# Patient Record
Sex: Male | Born: 1980
Health system: Southern US, Community
[De-identification: ages and names within clinical notes are randomized; demographics above are authoritative.]

## PROBLEM LIST (undated history)

## (undated) ENCOUNTER — Emergency Department (HOSPITAL_BASED_OUTPATIENT_CLINIC_OR_DEPARTMENT_OTHER): Admission: EM

## (undated) DIAGNOSIS — J45909 Unspecified asthma, uncomplicated: Secondary | ICD-10-CM

## (undated) DIAGNOSIS — IMO0001 Reserved for inherently not codable concepts without codable children: Secondary | ICD-10-CM

## (undated) DIAGNOSIS — G43909 Migraine, unspecified, not intractable, without status migrainosus: Secondary | ICD-10-CM

## (undated) DIAGNOSIS — B019 Varicella without complication: Secondary | ICD-10-CM

## (undated) DIAGNOSIS — R030 Elevated blood-pressure reading, without diagnosis of hypertension: Secondary | ICD-10-CM

## (undated) HISTORY — DX: Migraine, unspecified, not intractable, without status migrainosus: G43.909

## (undated) HISTORY — DX: Unspecified asthma, uncomplicated: J45.909

## (undated) HISTORY — DX: Reserved for inherently not codable concepts without codable children: IMO0001

## (undated) HISTORY — DX: Elevated blood-pressure reading, without diagnosis of hypertension: R03.0

## (undated) HISTORY — DX: Varicella without complication: B01.9

## (undated) HISTORY — PX: CRANIOTOMY: SHX93

---

## 2003-03-11 HISTORY — PX: BRAIN SURGERY: SHX531

## 2009-03-05 ENCOUNTER — Emergency Department (HOSPITAL_COMMUNITY): Admission: EM | Admit: 2009-03-05 | Discharge: 2009-03-05 | Payer: Self-pay | Admitting: Emergency Medicine

## 2010-06-10 LAB — HERPES SIMPLEX VIRUS CULTURE: Culture: DETECTED

## 2010-06-10 LAB — GC/CHLAMYDIA PROBE AMP, GENITAL: Chlamydia, DNA Probe: NEGATIVE

## 2011-04-28 DIAGNOSIS — I499 Cardiac arrhythmia, unspecified: Secondary | ICD-10-CM | POA: Insufficient documentation

## 2011-04-29 DIAGNOSIS — R002 Palpitations: Secondary | ICD-10-CM | POA: Insufficient documentation

## 2011-04-29 DIAGNOSIS — I10 Essential (primary) hypertension: Secondary | ICD-10-CM | POA: Insufficient documentation

## 2011-04-29 DIAGNOSIS — R079 Chest pain, unspecified: Secondary | ICD-10-CM | POA: Insufficient documentation

## 2011-05-13 DIAGNOSIS — Z9889 Other specified postprocedural states: Secondary | ICD-10-CM | POA: Insufficient documentation

## 2011-05-13 DIAGNOSIS — IMO0002 Reserved for concepts with insufficient information to code with codable children: Secondary | ICD-10-CM | POA: Insufficient documentation

## 2011-05-13 DIAGNOSIS — Z8669 Personal history of other diseases of the nervous system and sense organs: Secondary | ICD-10-CM | POA: Insufficient documentation

## 2011-05-13 DIAGNOSIS — S069XAA Unspecified intracranial injury with loss of consciousness status unknown, initial encounter: Secondary | ICD-10-CM | POA: Insufficient documentation

## 2011-05-13 DIAGNOSIS — S069X9A Unspecified intracranial injury with loss of consciousness of unspecified duration, initial encounter: Secondary | ICD-10-CM | POA: Insufficient documentation

## 2011-06-19 ENCOUNTER — Emergency Department: Payer: Self-pay | Admitting: Emergency Medicine

## 2015-05-09 ENCOUNTER — Ambulatory Visit: Payer: Self-pay | Admitting: Primary Care

## 2015-05-14 ENCOUNTER — Ambulatory Visit (INDEPENDENT_AMBULATORY_CARE_PROVIDER_SITE_OTHER): Payer: BLUE CROSS/BLUE SHIELD | Admitting: Primary Care

## 2015-05-14 ENCOUNTER — Encounter: Payer: Self-pay | Admitting: Primary Care

## 2015-05-14 ENCOUNTER — Other Ambulatory Visit: Payer: Self-pay | Admitting: Primary Care

## 2015-05-14 VITALS — BP 144/86 | HR 73 | Temp 98.7°F | Ht 70.5 in | Wt 156.4 lb

## 2015-05-14 DIAGNOSIS — R229 Localized swelling, mass and lump, unspecified: Secondary | ICD-10-CM

## 2015-05-14 DIAGNOSIS — R5383 Other fatigue: Secondary | ICD-10-CM | POA: Diagnosis not present

## 2015-05-14 DIAGNOSIS — E0590 Thyrotoxicosis, unspecified without thyrotoxic crisis or storm: Secondary | ICD-10-CM | POA: Insufficient documentation

## 2015-05-14 DIAGNOSIS — R3912 Poor urinary stream: Secondary | ICD-10-CM

## 2015-05-14 DIAGNOSIS — IMO0001 Reserved for inherently not codable concepts without codable children: Secondary | ICD-10-CM | POA: Insufficient documentation

## 2015-05-14 DIAGNOSIS — R030 Elevated blood-pressure reading, without diagnosis of hypertension: Secondary | ICD-10-CM | POA: Diagnosis not present

## 2015-05-14 LAB — CBC
HEMATOCRIT: 46.8 % (ref 39.0–52.0)
HEMOGLOBIN: 15.9 g/dL (ref 13.0–17.0)
MCHC: 34.1 g/dL (ref 30.0–36.0)
MCV: 89 fl (ref 78.0–100.0)
Platelets: 209 10*3/uL (ref 150.0–400.0)
RBC: 5.25 Mil/uL (ref 4.22–5.81)
RDW: 13.7 % (ref 11.5–15.5)
WBC: 11.6 10*3/uL — ABNORMAL HIGH (ref 4.0–10.5)

## 2015-05-14 LAB — COMPREHENSIVE METABOLIC PANEL
ALT: 14 U/L (ref 0–53)
AST: 16 U/L (ref 0–37)
Albumin: 4.8 g/dL (ref 3.5–5.2)
Alkaline Phosphatase: 74 U/L (ref 39–117)
BUN: 15 mg/dL (ref 6–23)
CO2: 28 meq/L (ref 19–32)
Calcium: 9.6 mg/dL (ref 8.4–10.5)
Chloride: 103 mEq/L (ref 96–112)
Creatinine, Ser: 0.97 mg/dL (ref 0.40–1.50)
GFR: 114.58 mL/min (ref >60.00)
GLUCOSE: 106 mg/dL — AB (ref 70–99)
POTASSIUM: 4.5 meq/L (ref 3.5–5.1)
SODIUM: 138 meq/L (ref 135–145)
Total Bilirubin: 0.8 mg/dL (ref 0.2–1.2)
Total Protein: 7.3 g/dL (ref 6.0–8.3)

## 2015-05-14 LAB — TSH: TSH: 0.22 u[IU]/mL — ABNORMAL LOW (ref 0.35–4.50)

## 2015-05-14 LAB — PSA: PSA: 0.94 ng/mL (ref 0.10–4.00)

## 2015-05-14 LAB — T3, FREE: T3, Free: 3.4 pg/mL (ref 2.3–4.2)

## 2015-05-14 LAB — T4, FREE: FREE T4: 0.95 ng/dL (ref 0.60–1.60)

## 2015-05-14 NOTE — Assessment & Plan Note (Signed)
Ongoing for years with palpitations, difficulty gaining weight. FH of hyperthyroidism from mother. Will obtain TSH, Free t4 and t3. Also CBC, CMP. Exam unremarkable.

## 2015-05-14 NOTE — Assessment & Plan Note (Signed)
Above goal in clinic today, endorses elevated reading over 1 year ago. Could be due to underlying thyroid abnormality. Will rule out other causes, then will consider treating. He will monitor BP at home.

## 2015-05-14 NOTE — Assessment & Plan Note (Signed)
Cyst like masses to upper extremities and anterior chest wall. Suspect lipoma or cyst in nature. Appear being.

## 2015-05-14 NOTE — Assessment & Plan Note (Signed)
Present for numerous years. Difficulty initiating and also weak stream. Will obtain PSA today. No FH of prostate cancer per patient,

## 2015-05-14 NOTE — Progress Notes (Signed)
Pre visit review using our clinic review tool, if applicable. No additional management support is needed unless otherwise documented below in the visit note. 

## 2015-05-14 NOTE — Patient Instructions (Addendum)
Complete lab work prior to leaving today. I will notify you of your results once received.   Check your blood pressure daily, around the same time of day, for the next sevearl weeks.   Ensure that you have rested for 30 minutes prior to checking your blood pressure. Record your readings. I will be in touch soon regarding your BP readings.  It was a pleasure to meet you today! Please don't hesitate to call me with any questions. Welcome to [facility]!

## 2015-05-14 NOTE — Progress Notes (Signed)
Subjective:    Patient ID: Eric Shannon, male    DOB: 11/03/1980, 35 y.o.   MRN: [id number]  HPI  Eric Shannon is a 35 year old male who presents today to establish care and discuss the problems mentioned below. Will obtain old records. His last physical was years ago.   1) Asthma: Diagnosed with mild asthma in his 20's. Will experience occasional shortness of breath, denies wheezing. Does not have an albuterol inhaler as it caused him to cough.   2) Frequent Headaches/Migraines: History of in the past. Drastically reduced since crainotomy in 2003. Currently gets headaches once weekly which are tolerable. Denies recent migraines.    3) Elevated BP readings: Endorses history of elevated readings in the past, strong FH of HTN. BP above goal today. His last BP reading was over 1 year. Also experiencing other symptoms that may be thyroid related.  4) Thyroid Abnormality: Fatigue, shortness of breath, daily tachycardia/palpitations, dry skin, difficulty gaining weight. Was told about 1 year ago that he needed testing for possible thyroid abnormality. He did obtain testing about 1 year ago and believes his T3 and T4 were abnormal, but cannot remember for sure. He had work up for his palpitations in the past by wearing a Holter monitor. He doesn't believe the monitor showed any abnormality. His mother has what sounds to be hyperthyroidism.   5) Weak Urinary Stream: Present for numerous years. Difficulty initiating his stream and also has a weaker stream. He was once told in the past that his prostate was slightly swollen in the past. Denies FH of prostate cancer.  Review of Systems  Constitutional: Positive for fatigue.       Difficulty gaining weight  HENT: Positive for postnasal drip.   Respiratory: Positive for shortness of breath. Negative for cough and wheezing.   Cardiovascular: Positive for palpitations. Negative for chest pain.  Gastrointestinal: Negative for diarrhea and constipation.         Bowel movements once to twice daily  Endocrine: Positive for heat intolerance.  Genitourinary: Positive for difficulty urinating.       Weak urinary stream  Skin:       Small bumps to extremities to chest and arms.  Allergic/Immunologic: Negative for environmental allergies.  Neurological: Negative for weakness and headaches.       Past Medical History  Diagnosis Date  . Asthma   . Chickenpox   . Migraine   . Elevated blood pressure     Social History   Social History  . Marital Status: Single    Spouse Name: N/A  . Number of Children: N/A  . Years of Education: N/A   Occupational History  . Not on file.   Social History Main Topics  . Smoking status: Former Games developermoker  . Smokeless tobacco: Not on file  . Alcohol Use: 0.0 oz/week    0 Standard drinks or equivalent per week     Comment: social  . Drug Use: No  . Sexual Activity: Not on file   Other Topics Concern  . Not on file   Social History Narrative   Single.   1 daughter.   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   Enjoys spending time with his daughter.    No past surgical history on file.  Family History  Problem Relation Age of Onset  . Hypertension Father   . Hypertension Mother   . Hyperthyroidism Mother     No Known Allergies  No current outpatient  prescriptions on file prior to visit.   No current facility-administered medications on file prior to visit.    BP 144/86 mmHg  Pulse 73  Temp(Src) 98.7 F (37.1 C) (Oral)  Ht 5' 10.5" (1.791 m)  Wt 156 lb 6.4 oz (70.943 kg)  BMI 22.12 kg/m2  SpO2 99%    Objective:   Physical Exam  Constitutional: He is oriented to person, place, and time. He appears well-nourished.  Neck: Neck supple. No thyromegaly present.  Cardiovascular: Normal rate and regular rhythm.   Pulmonary/Chest: Effort normal and breath sounds normal. He has no wheezes. He has no rales.  Musculoskeletal: Normal range of motion.  Neurological: He is alert and  oriented to person, place, and time.  Skin: Skin is warm and dry.  Numerous, small, cyst-like masses to upper extremities and chest.  Psychiatric: He has a normal mood and affect.          Assessment & Plan:

## 2015-05-15 ENCOUNTER — Encounter: Payer: Self-pay | Admitting: *Deleted

## 2015-07-02 ENCOUNTER — Ambulatory Visit (INDEPENDENT_AMBULATORY_CARE_PROVIDER_SITE_OTHER): Payer: BLUE CROSS/BLUE SHIELD | Admitting: Primary Care

## 2015-07-02 ENCOUNTER — Ambulatory Visit (INDEPENDENT_AMBULATORY_CARE_PROVIDER_SITE_OTHER)
Admission: RE | Admit: 2015-07-02 | Discharge: 2015-07-02 | Disposition: A | Discharge status: Home / Self Care | Payer: BLUE CROSS/BLUE SHIELD | Source: Ambulatory Visit | Attending: Primary Care | Admitting: Primary Care

## 2015-07-02 ENCOUNTER — Encounter: Payer: Self-pay | Admitting: Primary Care

## 2015-07-02 VITALS — BP 140/86 | HR 88 | Temp 98.4°F | Ht 70.5 in | Wt 164.1 lb

## 2015-07-02 DIAGNOSIS — Z113 Encounter for screening for infections with a predominantly sexual mode of transmission: Secondary | ICD-10-CM

## 2015-07-02 DIAGNOSIS — R229 Localized swelling, mass and lump, unspecified: Secondary | ICD-10-CM

## 2015-07-02 DIAGNOSIS — M542 Cervicalgia: Secondary | ICD-10-CM

## 2015-07-02 DIAGNOSIS — M549 Dorsalgia, unspecified: Secondary | ICD-10-CM

## 2015-07-02 DIAGNOSIS — M778 Other enthesopathies, not elsewhere classified: Secondary | ICD-10-CM

## 2015-07-02 DIAGNOSIS — G8929 Other chronic pain: Secondary | ICD-10-CM

## 2015-07-02 DIAGNOSIS — M659 Synovitis and tenosynovitis, unspecified: Secondary | ICD-10-CM

## 2015-07-02 LAB — HEPATITIS C ANTIBODY: HCV AB: NEGATIVE

## 2015-07-02 LAB — HIV ANTIBODY (ROUTINE TESTING W REFLEX): HIV 1&2 Ab, 4th Generation: NONREACTIVE

## 2015-07-02 MED ORDER — NAPROXEN 500 MG PO TABS: 500.0000 mg | ORAL_TABLET | Freq: Two times a day (BID) | ORAL | Status: DC

## 2015-07-02 NOTE — Assessment & Plan Note (Signed)
Located to right posterior shoulder for several weeks. Appears to be benign cyst and is much alike the cyst to his left forearm. Overall decreasing in size. Will watch and wait for now. Patient is to notify me if no improvement or causes discomfort.

## 2015-07-02 NOTE — Assessment & Plan Note (Signed)
Chronic stiffness and pain with radiculopathy to left shoulder and upper extremity for years since craniotomy in 2003. Today with decreased ROM and discomfort with ROM exercises. Xray of c-spine pending for further evaluation.

## 2015-07-02 NOTE — Progress Notes (Signed)
Pre visit review using our clinic review tool, if applicable. No additional management support is needed unless otherwise documented below in the visit note. 

## 2015-07-02 NOTE — Patient Instructions (Signed)
Complete lab work prior to leaving today. I will notify you of your results once received. It takes several days for us to receive all of the results.   Complete xray(s) prior to leaving today. I will notify you of your results once received.  You may take Naproxen 500 mg tablets twice daily as needed for inflammation and pain to your elbow.   It was a pleasure to see you today!  Tendinitis Tendinitis is swelling and inflammation of the tendons. Tendons are band-like tissues that connect muscle to bone. Tendinitis commonly occurs in the:   Shoulders (rotator cuff).  Heels (Achilles tendon).  Elbows (triceps tendon). CAUSES Tendinitis is usually caused by overusing the tendon, muscles, and joints involved. When the tissue surrounding a tendon (synovium) becomes inflamed, it is called tenosynovitis. Tendinitis commonly develops in people whose jobs require repetitive motions. SYMPTOMS  Pain.  Tenderness.  Mild swelling. DIAGNOSIS Tendinitis is usually diagnosed by physical exam. Your health care provider may also order X-rays or other imaging tests. TREATMENT Your health care provider may recommend certain medicines or exercises for your treatment. HOME CARE INSTRUCTIONS   Use a sling or splint for as long as directed by your health care provider until the pain decreases.  Put ice on the injured area.  Put ice in a plastic bag.  Place a towel between your skin and the bag.  Leave the ice on for 15-20 minutes, 3-4 times a day, or as directed by your health care provider.  Avoid using the limb while the tendon is painful. Perform gentle range of motion exercises only as directed by your health care provider. Stop exercises if pain or discomfort increase, unless directed otherwise by your health care provider.  Only take over-the-counter or prescription medicines for pain, discomfort, or fever as directed by your health care provider. SEEK MEDICAL CARE IF:   Your pain and  swelling increase.  You develop new, unexplained symptoms, especially increased numbness in the hands. MAKE SURE YOU:   Understand these instructions.  Will watch your condition.  Will get help right away if you are not doing well or get worse.   This information is not intended to replace advice given to you by your health care provider. Make sure you discuss any questions you have with your health care provider.   Document Released: 02/22/2000 Document Revised: 03/17/2014 Document Reviewed: 05/13/2010 Elsevier Interactive Patient Education [employer]2016 Elsevier Inc.

## 2015-07-02 NOTE — Progress Notes (Signed)
Subjective:    Patient ID: Eric Shannon, male    DOB: May 18, 1980, 35 y.o.   MRN: [id number]  HPI  Eric Shannon is a 35 year old male who presents today with multiple complaints.  1) STD testing: He has been in a monogamous relationship for several months. He noticed a whitish, milky discharge for 1 day on Thursday last week. Denies any penile itching, swelling, discharge, testicular swelling. He's not been tested recently. His partner was tested recently with unremarkable results.   2) Skin Mass: Located to the posterior right shoulder that has been present for the past 2 weeks. He's noticed a reduction in size overall. He's had several cysts on his upper extremities in the past of the same resemblance. Denies pain.   3) Elbow pain: Located to the left elbow, especially to the medial and lateral sides. 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 His pain has been present for the past 1 week. He's taken tylenol and advil without improvement in pain. He's also been wearing a brace to his elbow daily for the past 1 week.    4) Neck Pain and Radiculopathy: History of craniometry in 2003, has occasional numbness and pain to left lateral neck and c-spine with radiation to left shoulder and left lower extremity. He denies recent injury or trauma. His symptoms of radiculopathy will occur sporadically and are no worse since his craniotomy.     Review of Systems  Genitourinary: Negative for dysuria, discharge, penile swelling and penile pain.  Musculoskeletal: Positive for arthralgias, neck pain and neck stiffness.  Skin:       Mass to right posterior shoulder.   Neurological: Positive for numbness.       Past Medical History  Diagnosis Date  . Asthma   . Chickenpox   . Migraine   . Elevated blood pressure      Social History   Social History  . Marital Status: Single    Spouse Name: N/A  . Number of Children: N/A  . Years  of Education: N/A   Occupational History  . Not on file.   Social History Main Topics  . Smoking status: Former Games developer  . Smokeless tobacco: Not on file  . Alcohol Use: 0.0 oz/week    0 Standard drinks or equivalent per week     Comment: social  . Drug Use: No  . Sexual Activity: Not on file   Other Topics Concern  . Not on file   Social History Narrative   Single.   1 daughter.   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   Enjoys spending time with his daughter.    No past surgical history on file.  Family History  Problem Relation Age of Onset  . Hypertension Father   . Hypertension Mother   . Hyperthyroidism Mother     No Known Allergies  No current outpatient prescriptions on file prior to visit.   No current facility-administered medications on file prior to visit.    BP 140/86 mmHg  Pulse 88  Temp(Src) 98.4 F (36.9 C) (Oral)  Ht 5' 10.5" (1.791 m)  Wt 164 lb 1.9 oz (74.444 kg)  BMI 23.21 kg/m2  SpO2 98%    Objective:   Physical Exam  Constitutional: He appears well-nourished.  Neck: No muscular tenderness present.  Stiffness with discomfort to flexion and lateral bending.  Cardiovascular: Normal rate and regular rhythm.   Pulmonary/Chest: Effort  normal and breath sounds normal.  Musculoskeletal:       Left elbow: He exhibits decreased range of motion. He exhibits no swelling. No tenderness found.  Skin: Skin is warm and dry.  1.5 cm circular skin mass to right posterior shoulder. Soft, immobile. Representative of benign cyst. Non tender.           Assessment & Plan:  Tendosynovitis:  Pain to medial and lateral sides of left elbow. Exam consistent with tendon irritation. No swelling. Mild decrease in ROM. Suspect caused by repetitive movements to extremities based off of line of work. Will have him start Naproxen BID PRN. Continue to wear brace at work. Ice PRN. Rest if possible. If no improvement, will consider physical therapy.   STD  testing:  Requesting testing for STD's as he noticed a milky white penile discharge x 1 day Thursday last week. No symptoms over the weekend. Labs pending.

## 2015-07-03 ENCOUNTER — Other Ambulatory Visit: Payer: Self-pay | Admitting: Primary Care

## 2015-07-03 DIAGNOSIS — A749 Chlamydial infection, unspecified: Secondary | ICD-10-CM

## 2015-07-03 LAB — HSV(HERPES SMPLX)ABS-I+II(IGG+IGM)-BLD
HERPES SIMPLEX VRS I-IGM AB (EIA): 1.45 {index} — AB
HSV 1 Glycoprotein G Ab, IgG: 24.2 Index — ABNORMAL HIGH (ref <0.90)
HSV 2 Glycoprotein G Ab, IgG: <0.9 Index (ref <0.90)

## 2015-07-03 LAB — GC/CHLAMYDIA PROBE AMP
CT PROBE, AMP APTIMA: DETECTED — AB
GC PROBE AMP APTIMA: NOT DETECTED

## 2015-07-03 LAB — RPR

## 2015-07-03 MED ORDER — AZITHROMYCIN 250 MG PO TABS: 1000.0000 mg | ORAL_TABLET | Freq: Once | ORAL | Status: DC

## 2015-07-04 ENCOUNTER — Other Ambulatory Visit: Payer: Self-pay | Admitting: Primary Care

## 2015-07-04 DIAGNOSIS — M542 Cervicalgia: Secondary | ICD-10-CM

## 2015-07-10 ENCOUNTER — Ambulatory Visit
Admission: RE | Admit: 2015-07-10 | Discharge: 2015-07-10 | Disposition: A | Discharge status: Home / Self Care | Payer: BLUE CROSS/BLUE SHIELD | Source: Ambulatory Visit | Attending: Primary Care | Admitting: Primary Care

## 2015-07-10 DIAGNOSIS — M50322 Other cervical disc degeneration at C5-C6 level: Secondary | ICD-10-CM | POA: Diagnosis not present

## 2015-07-10 DIAGNOSIS — M542 Cervicalgia: Secondary | ICD-10-CM

## 2015-07-10 MED ORDER — GADOBENATE DIMEGLUMINE 529 MG/ML IV SOLN
15.0000 mL | Freq: Once | INTRAVENOUS | Status: AC | PRN
  Administered 2015-07-10: 15 mL via INTRAVENOUS

## 2015-07-12 ENCOUNTER — Other Ambulatory Visit: Payer: Self-pay | Admitting: Primary Care

## 2015-07-12 DIAGNOSIS — M542 Cervicalgia: Secondary | ICD-10-CM

## 2015-07-23 DIAGNOSIS — M50122 Cervical disc disorder at C5-C6 level with radiculopathy: Secondary | ICD-10-CM | POA: Diagnosis not present

## 2015-07-23 DIAGNOSIS — Z79899 Other long term (current) drug therapy: Secondary | ICD-10-CM | POA: Diagnosis not present

## 2015-07-23 DIAGNOSIS — M549 Dorsalgia, unspecified: Secondary | ICD-10-CM | POA: Diagnosis not present

## 2015-07-23 DIAGNOSIS — S161XXA Strain of muscle, fascia and tendon at neck level, initial encounter: Secondary | ICD-10-CM | POA: Diagnosis not present

## 2015-07-24 DIAGNOSIS — E0590 Thyrotoxicosis, unspecified without thyrotoxic crisis or storm: Secondary | ICD-10-CM | POA: Diagnosis not present

## 2015-07-27 DIAGNOSIS — E0590 Thyrotoxicosis, unspecified without thyrotoxic crisis or storm: Secondary | ICD-10-CM | POA: Diagnosis not present

## 2015-07-27 DIAGNOSIS — L739 Follicular disorder, unspecified: Secondary | ICD-10-CM | POA: Diagnosis not present

## 2015-08-01 DIAGNOSIS — M50122 Cervical disc disorder at C5-C6 level with radiculopathy: Secondary | ICD-10-CM | POA: Diagnosis not present

## 2015-08-27 DIAGNOSIS — M50122 Cervical disc disorder at C5-C6 level with radiculopathy: Secondary | ICD-10-CM | POA: Diagnosis not present

## 2015-08-27 DIAGNOSIS — M5020 Other cervical disc displacement, unspecified cervical region: Secondary | ICD-10-CM | POA: Insufficient documentation

## 2015-09-25 DIAGNOSIS — M5020 Other cervical disc displacement, unspecified cervical region: Secondary | ICD-10-CM | POA: Diagnosis not present

## 2015-09-25 DIAGNOSIS — M50122 Cervical disc disorder at C5-C6 level with radiculopathy: Secondary | ICD-10-CM | POA: Diagnosis not present

## 2015-09-25 DIAGNOSIS — Z79899 Other long term (current) drug therapy: Secondary | ICD-10-CM | POA: Diagnosis not present

## 2015-09-25 DIAGNOSIS — Z9889 Other specified postprocedural states: Secondary | ICD-10-CM | POA: Diagnosis not present

## 2015-10-31 ENCOUNTER — Encounter: Payer: Self-pay | Admitting: Family Medicine

## 2015-10-31 ENCOUNTER — Encounter: Payer: Self-pay | Admitting: Primary Care

## 2015-10-31 ENCOUNTER — Ambulatory Visit (INDEPENDENT_AMBULATORY_CARE_PROVIDER_SITE_OTHER): Payer: BLUE CROSS/BLUE SHIELD | Admitting: Family Medicine

## 2015-10-31 VITALS — BP 102/80 | HR 76 | Wt 154.0 lb

## 2015-10-31 DIAGNOSIS — M5489 Other dorsalgia: Secondary | ICD-10-CM

## 2015-10-31 DIAGNOSIS — M542 Cervicalgia: Secondary | ICD-10-CM | POA: Diagnosis not present

## 2015-10-31 MED ORDER — GABAPENTIN 100 MG PO CAPS: ORAL_CAPSULE | ORAL | 3 refills | Status: DC

## 2015-10-31 MED ORDER — MELOXICAM 15 MG PO TABS
15.0000 mg | ORAL_TABLET | Freq: Every day | ORAL | 1 refills | Status: DC
Start: 2015-10-31 — End: 2017-10-13

## 2015-10-31 NOTE — Patient Instructions (Signed)
Continue exercises Try to walk daily   Gabapentin capsules or tablets What is this medicine? GABAPENTIN (GA ba pen tin) is used to control partial seizures in adults with epilepsy. It is also used to treat certain types of nerve pain. This medicine may be used for other purposes; ask your health care provider or pharmacist if you have questions. What should I tell my health care provider before I take this medicine? They need to know if you have any of these conditions: -kidney disease -suicidal thoughts, plans, or attempt; a previous suicide attempt by you or a family member -an unusual or allergic reaction to gabapentin, other medicines, foods, dyes, or preservatives -pregnant or trying to get pregnant -breast-feeding How should I use this medicine? Take this medicine by mouth with a glass of water. Follow the directions on the prescription label. You can take it with or without food. If it upsets your stomach, take it with food.Take your medicine at regular intervals. Do not take it more often than directed. Do not stop taking except on your doctor's advice. If you are directed to break the 600 or 800 mg tablets in half as part of your dose, the extra half tablet should be used for the next dose. If you have not used the extra half tablet within 28 days, it should be thrown away. A special MedGuide will be given to you by the pharmacist with each prescription and refill. Be sure to read this information carefully each time. Talk to your pediatrician regarding the use of this medicine in children. Special care may be needed. Overdosage: If you think you have taken too much of this medicine contact a poison control center or emergency room at once. NOTE: This medicine is only for you. Do not share this medicine with others. What if I miss a dose? If you miss a dose, take it as soon as you can. If it is almost time for your next dose, take only that dose. Do not take double or extra  doses. What may interact with this medicine? Do not take this medicine with any of the following medications: -other gabapentin products This medicine may also interact with the following medications: -alcohol -antacids -antihistamines for allergy, cough and cold -certain medicines for anxiety or sleep -certain medicines for depression or psychotic disturbances -homatropine; hydrocodone -naproxen -narcotic medicines (opiates) for pain -phenothiazines like chlorpromazine, mesoridazine, prochlorperazine, thioridazine This list may not describe all possible interactions. Give your health care provider a list of all the medicines, herbs, non-prescription drugs, or dietary supplements you use. Also tell them if you smoke, drink alcohol, or use illegal drugs. Some items may interact with your medicine. What should I watch for while using this medicine? Visit your doctor or health care professional for regular checks on your progress. You may want to keep a record at home of how you feel your condition is responding to treatment. You may want to share this information with your doctor or health care professional at each visit. You should contact your doctor or health care professional if your seizures get worse or if you have any new types of seizures. Do not stop taking this medicine or any of your seizure medicines unless instructed by your doctor or health care professional. Stopping your medicine suddenly can increase your seizures or their severity. Wear a medical identification bracelet or chain if you are taking this medicine for seizures, and carry a card that lists all your medications. You may get drowsy, dizzy,  or have blurred vision. Do not drive, use machinery, or do anything that needs mental alertness until you know how this medicine affects you. To reduce dizzy or fainting spells, do not sit or stand up quickly, especially if you are an older patient. Alcohol can increase drowsiness and  dizziness. Avoid alcoholic drinks. Your mouth may get dry. Chewing sugarless gum or sucking hard candy, and drinking plenty of water will help. The use of this medicine may increase the chance of suicidal thoughts or actions. Pay special attention to how you are responding while on this medicine. Any worsening of mood, or thoughts of suicide or dying should be reported to your health care professional right away. Women who become pregnant while using this medicine may enroll in the [place] Antiepileptic Drug Pregnancy Registry by calling [phone]. This registry collects information about the safety of antiepileptic drug use during pregnancy. What side effects may I notice from receiving this medicine? Side effects that you should report to your doctor or health care professional as soon as possible: -allergic reactions like skin rash, itching or hives, swelling of the face, lips, or tongue -worsening of mood, thoughts or actions of suicide or dying Side effects that usually do not require medical attention (report to your doctor or health care professional if they continue or are bothersome): -constipation -difficulty walking or controlling muscle movements -dizziness -nausea -slurred speech -tiredness -tremors -weight gain This list may not describe all possible side effects. Call your doctor for medical advice about side effects. You may report side effects to FDA at 1-800-FDA-1088. Where should I keep my medicine? Keep out of reach of children. This medicine may cause accidental overdose and death if it taken by other adults, children, or pets. Mix any unused medicine with a substance like cat litter or coffee grounds. Then throw the medicine away in a sealed container like a sealed bag or a coffee can with a lid. Do not use the medicine after the expiration date. Store at room temperature between 15 and 30 degrees C (59 and 86 degrees F). NOTE: This sheet is a summary. It may  not cover all possible information. If you have questions about this medicine, talk to your doctor, pharmacist, or health care provider.    2016, Elsevier/Gold Standard. (2013-04-22 15:26:50)

## 2015-10-31 NOTE — Progress Notes (Signed)
Subjective:    Patient ID: Eric Shannon, male    DOB: 01/27/1981, 35 y.o.   MRN: [id number]  HPI This is a 35 yo male who presents today with back and neck pain for several years. He has been seeing a specialist in [place]. He has a bulging disk at C5-6. He has had some trigger point injections without relief. He missed his last appointment due to going to the wrong building and does not have another appointment until October 2. He has had worsening pain over the last several days. Has tried naprosyn, muscle relaxers, gabapentin in the past without much relief. Pain 5-6/10. He has had chronic left arm numbness after craniotomy. Pain now radiating to right side front and back. Nothing ever helps. 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 It hurts him to move in and out of vehicles. He has had PT in the past and "they gave me little exercises." He does not currently do any stretching/exercises.   When asked what he would like to accomplish from his visit today he stated that he just wants to feel better.   Currently taking naprosyn 500 mg po twice a day.   Has two year old daughter.   Past Medical History:  Diagnosis Date  . Asthma   . Chickenpox   . Elevated blood pressure   . Migraine    No past surgical history on file. Family History  Problem Relation Age of Onset  . Hypertension Father   . Hypertension Mother   . Hyperthyroidism Mother    Social History  Substance Use Topics  . Smoking status: Former Games developermoker  . Smokeless tobacco: Not on file  . Alcohol use 0.0 oz/week     Comment: social      Review of Systems Per HPI    Objective:   Physical Exam  Constitutional: He is oriented to person, place, and time. He appears well-developed and well-nourished. No distress.  HENT:  Right Ear: External ear normal.  Left Ear: External ear normal.  Nose: Nose normal.  Mouth/Throat: Oropharynx is clear and moist.  Eyes: Conjunctivae and EOM are normal.  Pupils are equal, round, and reactive to light. Right eye exhibits no discharge. Left eye exhibits no discharge.  Neck: Normal range of motion. Neck supple. Muscular tenderness present. No spinous process tenderness present. No neck rigidity. No edema, no erythema and normal range of motion present.  Cardiovascular: Normal rate.   Pulmonary/Chest: Effort normal.  Neurological: He is alert and oriented to person, place, and time. He has normal strength and normal reflexes. No cranial nerve deficit or sensory deficit.  Skin: Skin is warm and dry. He is not diaphoretic.  Psychiatric: He has a normal mood and affect. His behavior is normal. Judgment and thought content normal.  Vitals reviewed.     BP 102/80   Pulse 76   Wt 154 lb (69.9 kg)   SpO2 98%   BMI 21.78 kg/m  Wt Readings from Last 3 Encounters:  10/31/15 154 lb (69.9 kg)  07/02/15 164 lb 1.9 oz (74.4 kg)  05/14/15 156 lb 6.4 oz (70.9 kg)       Assessment & Plan:  1. Neck pain - discussed chronic nature of his neck/back pain and reviewed previous interventions and what we can try going forward.  - encouraged him to continue planned follow up with specialist - discussed importance of continuing exercises, regular walking, core exercise strengthening.  - meloxicam (MOBIC)  15 MG tablet; Take 1 tablet (15 mg total) by mouth daily.  Dispense: 30 tablet; Refill: 1 - gabapentin (NEURONTIN) 100 MG capsule; Take one capsule at bedtime for a week then increase to one capsule twice a day  Dispense: 60 capsule; Refill: 3. Discussed ability to upward titrate   2. Midline back pain, unspecified location - meloxicam (MOBIC) 15 MG tablet; Take 1 tablet (15 mg total) by mouth daily.  Dispense: 30 tablet; Refill: 1 - gabapentin (NEURONTIN) 100 MG capsule; Take one capsule at bedtime for a week then increase to one capsule twice a day  Dispense: 60 capsule; Refill: 3  - he needs to come in for blood work for work related paperwork. Can follow  up back/neck pain at that time.   Olean Reeeborah Gessner, FNP-BC  [facility], [facility]  11/01/2015 10:55 AM

## 2015-12-07 ENCOUNTER — Ambulatory Visit (INDEPENDENT_AMBULATORY_CARE_PROVIDER_SITE_OTHER): Payer: BLUE CROSS/BLUE SHIELD | Admitting: Primary Care

## 2015-12-07 ENCOUNTER — Encounter: Payer: BLUE CROSS/BLUE SHIELD | Admitting: Primary Care

## 2015-12-07 ENCOUNTER — Encounter: Payer: Self-pay | Admitting: Primary Care

## 2015-12-07 VITALS — BP 126/80 | HR 80 | Temp 98.0°F | Ht 71.0 in | Wt 159.0 lb

## 2015-12-07 DIAGNOSIS — Z0000 Encounter for general adult medical examination without abnormal findings: Secondary | ICD-10-CM | POA: Diagnosis not present

## 2015-12-07 DIAGNOSIS — M50122 Cervical disc disorder at C5-C6 level with radiculopathy: Secondary | ICD-10-CM

## 2015-12-07 DIAGNOSIS — I499 Cardiac arrhythmia, unspecified: Secondary | ICD-10-CM | POA: Diagnosis not present

## 2015-12-07 DIAGNOSIS — Z23 Encounter for immunization: Secondary | ICD-10-CM | POA: Diagnosis not present

## 2015-12-07 DIAGNOSIS — IMO0001 Reserved for inherently not codable concepts without codable children: Secondary | ICD-10-CM

## 2015-12-07 DIAGNOSIS — Z0001 Encounter for general adult medical examination with abnormal findings: Secondary | ICD-10-CM | POA: Insufficient documentation

## 2015-12-07 DIAGNOSIS — E0590 Thyrotoxicosis, unspecified without thyrotoxic crisis or storm: Secondary | ICD-10-CM

## 2015-12-07 DIAGNOSIS — R030 Elevated blood-pressure reading, without diagnosis of hypertension: Secondary | ICD-10-CM

## 2015-12-07 LAB — LIPID PANEL
CHOL/HDL RATIO: 2
Cholesterol: 143 mg/dL (ref 0–200)
HDL: 58.4 mg/dL (ref >39.00)
LDL Cholesterol: 71 mg/dL (ref 0–99)
NONHDL: 84.36
TRIGLYCERIDES: 65 mg/dL (ref 0.0–149.0)
VLDL: 13 mg/dL (ref 0.0–40.0)

## 2015-12-07 LAB — COMPREHENSIVE METABOLIC PANEL
ALBUMIN: 4.4 g/dL (ref 3.5–5.2)
ALK PHOS: 69 U/L (ref 39–117)
ALT: 14 U/L (ref 0–53)
AST: 16 U/L (ref 0–37)
BILIRUBIN TOTAL: 0.7 mg/dL (ref 0.2–1.2)
BUN: 13 mg/dL (ref 6–23)
CALCIUM: 9.1 mg/dL (ref 8.4–10.5)
CHLORIDE: 103 meq/L (ref 96–112)
CO2: 32 mEq/L (ref 19–32)
CREATININE: 1.03 mg/dL (ref 0.40–1.50)
GFR: 106.55 mL/min (ref >60.00)
Glucose, Bld: 90 mg/dL (ref 70–99)
Potassium: 4.1 mEq/L (ref 3.5–5.1)
SODIUM: 138 meq/L (ref 135–145)
TOTAL PROTEIN: 7.4 g/dL (ref 6.0–8.3)

## 2015-12-07 NOTE — Assessment & Plan Note (Signed)
Rate and rhythm regular today. 

## 2015-12-07 NOTE — Progress Notes (Signed)
Subjective:    Patient ID: Eric Shannon, male    DOB: 02/19/1981, 35 y.o.   MRN: [id number]  HPI  Eric Shannon is a 35 year old male who presents today for complete physical.  Immunizations: -Tetanus: Unsure, believes it's been at or over 10 years.  -Influenza: Declines   Diet: Endorses a fair diet. Breakfast: Fast food Lunch: Restaurants, fast food, left overs Dinner: Chicken, shrimp, fish, vegetables, potatoes, mac and cheese Snacks: Little Debbie cakes, peanut butter crackers, oatmeal Desserts: Daily  Beverages: Water, little lemonade, juice, occasional soda  Exercise: He does not routinely exercise. Eye exam: Completed years ago. No acute change in vision.  Dental exam: Has not completed recently.    Review of Systems  Constitutional: Negative for unexpected weight change.  HENT: Negative for rhinorrhea.   Respiratory: Negative for cough and shortness of breath.   Cardiovascular: Negative for chest pain.  Gastrointestinal: Negative for constipation and diarrhea.  Genitourinary: Negative for difficulty urinating.  Musculoskeletal: Positive for neck pain. Negative for myalgias.  Skin: Negative for rash.  Allergic/Immunologic: Positive for environmental allergies.  Neurological: Negative for dizziness, numbness and headaches.  Psychiatric/Behavioral:       Denies concerns for anxiety or depression       Past Medical History:  Diagnosis Date  . Asthma   . Chickenpox   . Elevated blood pressure   . Migraine      Social History   Social History  . Marital status: Single    Spouse name: N/A  . Number of children: N/A  . Years of education: N/A   Occupational History  . Not on file.   Social History Main Topics  . Smoking status: Former Games developermoker  . Smokeless tobacco: Not on file  . Alcohol use 0.0 oz/week     Comment: social  . Drug use: No  . Sexual activity: Not on file   Other Topics Concern  . Not on file   Social History Narrative   Single.     1 daughter.   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   Enjoys spending time with his daughter.    No past surgical history on file.  Family History  Problem Relation Age of Onset  . Hypertension Father   . Hypertension Mother   . Hyperthyroidism Mother     No Known Allergies  Current Outpatient Prescriptions on File Prior to Visit  Medication Sig Dispense Refill  . gabapentin (NEURONTIN) 100 MG capsule Take one capsule at bedtime for a week then increase to one capsule twice a day (Patient not taking: Reported on 12/07/2015) 60 capsule 3  . meloxicam (MOBIC) 15 MG tablet Take 1 tablet (15 mg total) by mouth daily. (Patient not taking: Reported on 12/07/2015) 30 tablet 1   No current facility-administered medications on file prior to visit.     BP 126/80   Pulse 80   Temp 98 F (36.7 C) (Oral)   Ht 5\' 11"  (1.803 m)   Wt 159 lb (72.1 kg)   SpO2 98%   BMI 22.18 kg/m    Objective:   Physical Exam  Constitutional: He is oriented to person, place, and time. He appears well-nourished.  HENT:  Right Ear: Tympanic membrane and ear canal normal.  Left Ear: Tympanic membrane and ear canal normal.  Nose: Nose normal. Right sinus exhibits no maxillary sinus tenderness and no frontal sinus tenderness. Left sinus exhibits no maxillary sinus tenderness and no frontal sinus tenderness.  Mouth/Throat: Oropharynx is clear and moist.  Eyes: Conjunctivae and EOM are normal. Pupils are equal, round, and reactive to light.  Neck: Neck supple. Carotid bruit is not present. No thyromegaly present.  Cardiovascular: Normal rate, regular rhythm and normal heart sounds.   Pulmonary/Chest: Effort normal and breath sounds normal. He has no wheezes. He has no rales.  Abdominal: Soft. Bowel sounds are normal. There is no tenderness.  Musculoskeletal: Normal range of motion.  Neurological: He is alert and oriented to person, place, and time. He has normal reflexes. No cranial nerve deficit.  Skin:  Skin is warm and dry.  Psychiatric: He has a normal mood and affect.          Assessment & Plan:

## 2015-12-07 NOTE — Assessment & Plan Note (Signed)
Stable on today's visit, continue to monitor.

## 2015-12-07 NOTE — Patient Instructions (Signed)
You were provided with a tetanus vaccination which will cover you for 10 years.  Complete lab work prior to leaving today. I will notify you of your results once received.   It's importance to improve your diet by reducing consumption of fast food, fried food, processed snack foods, sugary drinks. Increase consumption of fresh vegetables and fruits, whole grains, water.  Ensure you are drinking 64 ounces of water daily.  Start exercising. You should be getting 150 minutes of moderate intensity exercise weekly.  I recommend an eye evaluation every 1-2 years, and dental evaluation annually.  Follow up in 1 year for repeat physical or sooner If needed.  It was a pleasure to see you today!

## 2015-12-07 NOTE — Assessment & Plan Note (Signed)
Working with specialist for further evaluation.

## 2015-12-07 NOTE — Assessment & Plan Note (Signed)
Tdap due, provided today, declines influenza. Fair diet, discussed to increased vegetables, fruit, whole grains, water. Discussed regular exercise. Exam today unremarkable. Labs pending. Follow up in 1 year for repeat physical.

## 2015-12-07 NOTE — Addendum Note (Signed)
Addended by: Tawnya CrookSAMBATH, Idrissa Beville on: 12/07/2015 03:08 PM   Modules accepted: Orders

## 2015-12-07 NOTE — Progress Notes (Signed)
Pre visit review using our clinic review tool, if applicable. No additional management support is needed unless otherwise documented below in the visit note. 

## 2015-12-07 NOTE — Assessment & Plan Note (Signed)
Following with endocrinology for hyperthyroidism.

## 2015-12-10 ENCOUNTER — Encounter: Payer: Self-pay | Admitting: *Deleted

## 2015-12-10 DIAGNOSIS — S161XXA Strain of muscle, fascia and tendon at neck level, initial encounter: Secondary | ICD-10-CM | POA: Diagnosis not present

## 2015-12-10 DIAGNOSIS — M50122 Cervical disc disorder at C5-C6 level with radiculopathy: Secondary | ICD-10-CM | POA: Diagnosis not present

## 2015-12-21 ENCOUNTER — Telehealth: Payer: Self-pay

## 2015-12-21 NOTE — Telephone Encounter (Signed)
Eric Shannon with [facility] left v/m that pt tested positive STD and [facility] has 7 days to submit treatment and as of today is over 30 days.Please advise. Fax # [phone].

## 2015-12-26 NOTE — Telephone Encounter (Signed)
Faxed a copy of the communicable disease report as requested.   Noted that on 07/03/2015, copy of the form [facility]

## 2016-02-29 ENCOUNTER — Ambulatory Visit (INDEPENDENT_AMBULATORY_CARE_PROVIDER_SITE_OTHER): Payer: BLUE CROSS/BLUE SHIELD | Admitting: Primary Care

## 2016-02-29 ENCOUNTER — Encounter (INDEPENDENT_AMBULATORY_CARE_PROVIDER_SITE_OTHER): Payer: Self-pay

## 2016-02-29 ENCOUNTER — Encounter: Payer: Self-pay | Admitting: Primary Care

## 2016-02-29 VITALS — BP 122/78 | HR 78 | Temp 98.6°F | Ht 70.5 in | Wt 160.8 lb

## 2016-02-29 DIAGNOSIS — R369 Urethral discharge, unspecified: Secondary | ICD-10-CM

## 2016-02-29 LAB — POC URINALSYSI DIPSTICK (AUTOMATED)
Bilirubin, UA: NEGATIVE
Blood, UA: NEGATIVE
Glucose, UA: NEGATIVE
KETONES UA: NEGATIVE
LEUKOCYTES UA: NEGATIVE
Nitrite, UA: NEGATIVE
PROTEIN UA: NEGATIVE
Spec Grav, UA: 1.02
UROBILINOGEN UA: NEGATIVE
pH, UA: 7

## 2016-02-29 MED ORDER — AZITHROMYCIN 250 MG PO TABS: ORAL_TABLET | ORAL | 0 refills | Status: DC

## 2016-02-29 MED ORDER — CEFTRIAXONE SODIUM 250 MG IJ SOLR
250.0000 mg | Freq: Once | INTRAMUSCULAR | Status: AC
  Administered 2016-02-29: 250 mg via INTRAMUSCULAR

## 2016-02-29 NOTE — Progress Notes (Signed)
Subjective:    Patient ID: Eric Shannon, male    DOB: 08/07/1980, 35 y.o.   MRN: [id number]  HPI  Mr. Eric Shannon is a 35 year old male who presents today with a chief complaint of foul smelling urine. He's also noticed clear/milky penile discharge and some difficulty passing urine. He denies penile itching/pain/swelling, scrotal pain/swelling, dysuria, frequency, hematuria, flank pain. His symptoms have been present for the past 1 week. He and his partner are using VCF contraceptive film for which they've used for several months. His partner is using a summer's eve vaginal spray which she's used for months. He and his partner do not always use protection during intercourse.   Review of Systems  Constitutional: Negative for fatigue and fever.  Gastrointestinal: Negative for abdominal pain and nausea.  Genitourinary: Positive for difficulty urinating and discharge. Negative for decreased urine volume, dysuria, flank pain, frequency, genital sores, hematuria, penile pain, penile swelling, scrotal swelling and testicular pain.       Cloudy and foul smelling urine       Past Medical History:  Diagnosis Date  . Asthma   . Chickenpox   . Elevated blood pressure   . Migraine      Social History   Social History  . Marital status: Single    Spouse name: N/A  . Number of children: N/A  . Years of education: N/A   Occupational History  . Not on file.   Social History Main Topics  . Smoking status: Former Games developermoker  . Smokeless tobacco: Not on file  . Alcohol use 0.0 oz/week     Comment: social  . Drug use: No  . Sexual activity: Not on file   Other Topics Concern  . Not on file   Social History Narrative   Single.   1 daughter.   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   Enjoys spending time with his daughter.    No past surgical history on file.  Family History  Problem Relation Age of Onset  . Hypertension Father   . Hypertension Mother   . Hyperthyroidism Mother      No Known Allergies  Current Outpatient Prescriptions on File Prior to Visit  Medication Sig Dispense Refill  . gabapentin (NEURONTIN) 100 MG capsule Take one capsule at bedtime for a week then increase to one capsule twice a day (Patient not taking: Reported on 02/29/2016) 60 capsule 3  . meloxicam (MOBIC) 15 MG tablet Take 1 tablet (15 mg total) by mouth daily. (Patient not taking: Reported on 02/29/2016) 30 tablet 1   No current facility-administered medications on file prior to visit.     BP 122/78   Pulse 78   Temp 98.6 F (37 C) (Oral)   Ht 5' 10.5" (1.791 m)   Wt 160 lb 12.8 oz (72.9 kg)   SpO2 99%   BMI 22.75 kg/m    Objective:   Physical Exam  Constitutional: He appears well-nourished.  Neck: Neck supple.  Cardiovascular: Normal rate and regular rhythm.   Pulmonary/Chest: Effort normal and breath sounds normal.  Abdominal: Soft. Bowel sounds are normal.  Genitourinary: Right testis shows no swelling. Left testis shows no swelling. No penile erythema or penile tenderness. No discharge found.  Skin: Skin is warm and dry.          Assessment & Plan:  Penile Discharge:  Clear/milky white discharge x 1 day. Also with foul smelling urine, some difficulty urinating. Exam today unremarkable. UA: Negative  for leuks, nitrites, blood, glucose Gonorrhea/Chlamydia urine sent. He declines other STD testing as this was recently preformed.  Will treat prophylactic for STD. IM Rocephin and oral Azithromycin provided. Discussed to increase consumption of water, no intercourse for now, return precautions provided.  Morrie Sheldonlark,Kaedence Connelly Kendal, NP

## 2016-02-29 NOTE — Addendum Note (Signed)
Addended by: Tawnya CrookSAMBATH, Jamiaya Bina on: 02/29/2016 10:25 AM   Modules accepted: Orders

## 2016-02-29 NOTE — Addendum Note (Signed)
Addended by: Tawnya CrookSAMBATH, Shawon Denzer on: 02/29/2016 11:22 AM   Modules accepted: Orders

## 2016-02-29 NOTE — Patient Instructions (Addendum)
Your urine was negative for infection.   We will notify you of your other urine results once received.  Ensure you are consuming 64 ounces of water daily.  Start Azithromycin antibiotic tablets. Take 4 tablets by mouth once. You were provided with an injection of Rocephin antibiotics today.  Please notify me if you develop fevers, increased discharge, penile pain/swelling, blood in your urine, back pain.  It was a pleasure to see you today!

## 2016-02-29 NOTE — Progress Notes (Signed)
Pre visit review using our clinic review tool, if applicable. No additional management support is needed unless otherwise documented below in the visit note. 

## 2016-03-01 LAB — GC/CHLAMYDIA PROBE AMP
CT PROBE, AMP APTIMA: NOT DETECTED
GC PROBE AMP APTIMA: NOT DETECTED

## 2016-03-04 ENCOUNTER — Telehealth: Payer: Self-pay | Admitting: Primary Care

## 2016-03-04 NOTE — Telephone Encounter (Signed)
Patient called and asked for Johny DrillingChan to call him back about his lab results.  He said Johny DrillingChan was going to leave a message for Jae DireKate about his symptoms.

## 2016-03-05 NOTE — Telephone Encounter (Signed)
Spoken to patient and notified him of Kate's comments in results note.

## 2016-03-11 ENCOUNTER — Telehealth: Payer: Self-pay | Admitting: Primary Care

## 2016-03-11 NOTE — Telephone Encounter (Signed)
Please get more information.  1.What symptoms is he experiencing? 2. Any fevers, penile pain? 3. Any improvement in penile discharge?

## 2016-03-11 NOTE — Telephone Encounter (Signed)
Please set him up with a male provider in our office for further evaluation.

## 2016-03-11 NOTE — Telephone Encounter (Signed)
Called left message to call back 

## 2016-03-11 NOTE — Telephone Encounter (Signed)
Patient left a message on triage line today 03/11/2016 to speak to Cornfieldshan. He states antibiotics not working and was previously discussed. Call back number is [phone]  ASAP, thanks

## 2016-03-11 NOTE — Telephone Encounter (Signed)
Spoken to patient.  1. Penile discharge still. 2. No fevers and no penile pain. 3. No improvement still about the same discharge.  Patient wanted to know if he needs another round of antibiotics or to be seen.

## 2016-03-12 ENCOUNTER — Ambulatory Visit: Payer: BLUE CROSS/BLUE SHIELD | Admitting: Family Medicine

## 2016-03-12 NOTE — Telephone Encounter (Signed)
Spoken to patient and schedule appt with Dr Para Marchuncan on 03/13/2016.

## 2016-03-13 ENCOUNTER — Encounter: Payer: Self-pay | Admitting: Family Medicine

## 2016-03-13 ENCOUNTER — Ambulatory Visit (INDEPENDENT_AMBULATORY_CARE_PROVIDER_SITE_OTHER): Payer: BLUE CROSS/BLUE SHIELD | Admitting: Family Medicine

## 2016-03-13 DIAGNOSIS — R369 Urethral discharge, unspecified: Secondary | ICD-10-CM

## 2016-03-13 MED ORDER — METRONIDAZOLE 500 MG PO TABS: 1000.0000 mg | ORAL_TABLET | Freq: Two times a day (BID) | ORAL | 0 refills | Status: DC

## 2016-03-13 NOTE — Progress Notes (Signed)
Prev penile discharge.  Prev testing neg.  Prev treated with rocephin and azithromycin.  Still with discharge.  Prev with pos Chlam in 06/2015, treated at the time, sx resolved.  He didn't have return of sx until recently.      No FCNAVD.  No abd pain.  He feels well except for discharge.  No burning with urination but some mild itching.  Not with multiple partners, only single partner.   Meds, vitals, and allergies reviewed.   ROS: Per HPI unless specifically indicated in ROS section   nad ncat rrr ctab Scant penile discharge noted w/o lesions.   I did micro exam.  No trich seen on exam.

## 2016-03-13 NOTE — Patient Instructions (Signed)
Start flagyl- take 2 tabs twice a day for 1 day. Update us if not better in a few days.  Take care.  Glad to see you.

## 2016-03-13 NOTE — Progress Notes (Signed)
Pre visit review using our clinic review tool, if applicable. No additional management support is needed unless otherwise documented below in the visit note. 

## 2016-03-14 DIAGNOSIS — R360 Urethral discharge without blood: Secondary | ICD-10-CM | POA: Insufficient documentation

## 2016-03-14 DIAGNOSIS — R369 Urethral discharge, unspecified: Secondary | ICD-10-CM | POA: Insufficient documentation

## 2016-03-14 NOTE — Assessment & Plan Note (Signed)
Recently gc and chlam neg, still with discharge, no trich seen but would still treat for unspecified urethritis.  D/w pt.  If not better with metronidazole, then update us.  He agrees.  Okay for outpatient f/u.  D/w pt about safer sex.

## 2016-03-17 ENCOUNTER — Telehealth: Payer: Self-pay | Admitting: *Deleted

## 2016-03-17 NOTE — Telephone Encounter (Signed)
Patient called to advise that he has taken the medication prescribed and is still having a discharge from his penis. Patient wants to know what else should be done?

## 2016-03-18 NOTE — Telephone Encounter (Signed)
Please notify patient that the next step would be to see Urology as all of our testing and treatment has been unsuccessful. If he's agreeable I will place the referral.

## 2016-03-18 NOTE — Telephone Encounter (Signed)
Please talk to me about this.  Thanks.  

## 2016-03-18 NOTE — Telephone Encounter (Signed)
Spoke to pt. He said [place] would be better.

## 2016-03-20 ENCOUNTER — Telehealth: Payer: Self-pay | Admitting: Primary Care

## 2016-03-20 ENCOUNTER — Other Ambulatory Visit: Payer: Self-pay | Admitting: Primary Care

## 2016-03-20 DIAGNOSIS — R369 Urethral discharge, unspecified: Secondary | ICD-10-CM

## 2016-03-20 NOTE — Telephone Encounter (Signed)
Patient is calling back about the Urology Referral. Please place the referral in Epic, He wants to go to [place].

## 2016-03-20 NOTE — Telephone Encounter (Signed)
Referral placed.

## 2016-03-25 ENCOUNTER — Ambulatory Visit (INDEPENDENT_AMBULATORY_CARE_PROVIDER_SITE_OTHER): Payer: BLUE CROSS/BLUE SHIELD | Admitting: Family Medicine

## 2016-03-25 VITALS — BP 122/80 | HR 81 | Temp 98.8°F | Wt 161.2 lb

## 2016-03-25 DIAGNOSIS — R369 Urethral discharge, unspecified: Secondary | ICD-10-CM

## 2016-03-25 LAB — POC URINALSYSI DIPSTICK (AUTOMATED)
Bilirubin, UA: NEGATIVE
Blood, UA: NEGATIVE
GLUCOSE UA: NEGATIVE
KETONES UA: NEGATIVE
LEUKOCYTES UA: NEGATIVE
Nitrite, UA: NEGATIVE
PROTEIN UA: NEGATIVE
Spec Grav, UA: >=1.03
Urobilinogen, UA: 0.2
pH, UA: 6

## 2016-03-25 NOTE — Addendum Note (Signed)
Addended by: Desmond DikeKNIGHT, Katarina Riebe H on: 03/25/2016 12:23 PM   Modules accepted: Orders

## 2016-03-25 NOTE — Patient Instructions (Signed)
It was nice to meet you.  Please stop by to see Eric Shannon on your way out.

## 2016-03-25 NOTE — Progress Notes (Signed)
Subjective:   Patient ID: Eric Shannon, male    DOB: 02-Jan-1981, 36 y.o.   MRN: [id number]  Eric Shannon is a pleasant 36 y.o. year old male pt of Mayra Reel, new to me, who presents to clinic today with Follow-up (pt denies any changes from previoous OV)  on 03/25/2016  HPI:  Chart reviewed.  Saw PCP on 02/29/16 for foul smelling urine and penile discharge.  UA neg.  Treated pro phylactically for STD with IM rocephin and oral azithromycin. GC/Chlamydia was neg.  Felt symptoms consistent with urethritis.  H/o chlamydia which was treated in 06/2015.  RTC on 03/13/16 and saw Dr. Para March for persistent penile discharge. Notes again reviewed.  Treated with course of flagyl but symptoms have not improved.  No burning with urination but still having persistent penile discharge.  Current Outpatient Prescriptions on File Prior to Visit  Medication Sig Dispense Refill  . gabapentin (NEURONTIN) 100 MG capsule Take one capsule at bedtime for a week then increase to one capsule twice a day (Patient not taking: Reported on 03/25/2016) 60 capsule 3  . meloxicam (MOBIC) 15 MG tablet Take 1 tablet (15 mg total) by mouth daily. (Patient not taking: Reported on 03/25/2016) 30 tablet 1   No current facility-administered medications on file prior to visit.     No Known Allergies  Past Medical History:  Diagnosis Date  . Asthma   . Chickenpox   . Elevated blood pressure   . Migraine     No past surgical history on file.  Family History  Problem Relation Age of Onset  . Hypertension Father   . Hypertension Mother   . Hyperthyroidism Mother     Social History   Social History  . Marital status: Single    Spouse name: N/A  . Number of children: N/A  . Years of education: N/A   Occupational History  . Not on file.   Social History Main Topics  . Smoking status: Former Games developer  . Smokeless tobacco: Never Used  . Alcohol use 0.0 oz/week     Comment: social  . Drug use: No  .  Sexual activity: Not on file   Other Topics Concern  . Not on file   Social History Narrative   Single.   1 daughter.   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   Enjoys spending time with his daughter.   The PMH, PSH, Social History, Family History, Medications, and allergies have been reviewed in [facility], and have been updated if relevant.    Review of Systems  Genitourinary: Positive for discharge. Negative for decreased urine volume, difficulty urinating, dysuria, enuresis, flank pain, frequency, genital sores, hematuria, penile pain, penile swelling, scrotal swelling, testicular pain and urgency.  All other systems reviewed and are negative.      Objective:    BP 122/80   Pulse 81   Temp 98.8 F (37.1 C) (Oral)   Wt 161 lb 4 oz (73.1 kg)   SpO2 98%   BMI 22.81 kg/m    Physical Exam  Constitutional: He is oriented to person, place, and time. He appears well-developed and well-nourished. No distress.  HENT:  Head: Normocephalic.  Eyes: Conjunctivae are normal.  Cardiovascular: Normal rate.   Pulmonary/Chest: Effort normal.  Genitourinary: Penis normal. No penile tenderness.  Neurological: He is alert and oriented to person, place, and time. No cranial nerve deficit.  Skin: Skin is warm and dry. He is not diaphoretic.  Psychiatric: He  has a normal mood and affect. His behavior is normal. Judgment and thought content normal.  Nursing note and vitals reviewed.         Assessment & Plan:   Penile discharge - Plan: Ambulatory referral to Urology No Follow-up on file.

## 2016-03-25 NOTE — Assessment & Plan Note (Signed)
Recent neg GC/chalmydia testing. Treated with flagyl last week ( pro phylactically against trich). UA neg. No further rx given today, refer to urology. The patient indicates understanding of these issues and agrees with the plan.

## 2016-04-04 ENCOUNTER — Ambulatory Visit: Payer: BLUE CROSS/BLUE SHIELD | Admitting: Urology

## 2016-04-04 ENCOUNTER — Encounter: Payer: Self-pay | Admitting: Urology

## 2016-04-04 VITALS — BP 132/77 | HR 73 | Ht 71.0 in | Wt 163.0 lb

## 2016-04-04 DIAGNOSIS — R369 Urethral discharge, unspecified: Secondary | ICD-10-CM | POA: Diagnosis not present

## 2016-04-04 LAB — URINALYSIS, COMPLETE
BILIRUBIN UA: NEGATIVE
GLUCOSE, UA: NEGATIVE
KETONES UA: NEGATIVE
LEUKOCYTES UA: NEGATIVE
Nitrite, UA: NEGATIVE
PROTEIN UA: NEGATIVE
RBC UA: NEGATIVE
Specific Gravity, UA: 1.02 (ref 1.005–1.030)
Urobilinogen, Ur: 0.2 mg/dL (ref 0.2–1.0)
pH, UA: 6 (ref 5.0–7.5)

## 2016-04-04 LAB — MICROSCOPIC EXAMINATION
BACTERIA UA: NONE SEEN
Epithelial Cells (non renal): NONE SEEN /hpf (ref 0–10)
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
## 2016-04-08 ENCOUNTER — Ambulatory Visit: Payer: Self-pay | Admitting: Urology

## 2016-06-03 ENCOUNTER — Ambulatory Visit (INDEPENDENT_AMBULATORY_CARE_PROVIDER_SITE_OTHER): Payer: BLUE CROSS/BLUE SHIELD

## 2016-06-03 ENCOUNTER — Telehealth: Payer: Self-pay

## 2016-06-03 VITALS — BP 168/87 | HR 94 | Ht 71.0 in | Wt 160.3 lb

## 2016-06-03 DIAGNOSIS — R310 Gross hematuria: Secondary | ICD-10-CM

## 2016-06-03 LAB — URINALYSIS, COMPLETE
Bilirubin, UA: NEGATIVE
Glucose, UA: NEGATIVE
Ketones, UA: NEGATIVE
LEUKOCYTES UA: NEGATIVE
NITRITE UA: NEGATIVE
PH UA: 5.5 (ref 5.0–7.5)
Protein, UA: NEGATIVE
RBC, UA: NEGATIVE
Specific Gravity, UA: 1.01 (ref 1.005–1.030)
Urobilinogen, Ur: 0.2 mg/dL (ref 0.2–1.0)

## 2016-06-03 LAB — MICROSCOPIC EXAMINATION
BACTERIA UA: NONE SEEN
EPITHELIAL CELLS (NON RENAL): NONE SEEN /HPF (ref 0–10)
WBC, UA: NONE SEEN /hpf (ref 0–?)

## 2016-06-03 NOTE — Progress Notes (Signed)
Pt presents today with c/o itching of urethra/tip of penis and gross hematuria. A clean catch specimen was obtained for u/a and cx.   Blood pressure (!) 168/87, pulse 94, height 5\' 11"  (1.803 m), weight 160 lb 4.8 oz (72.7 kg).

## 2016-06-03 NOTE — Telephone Encounter (Signed)
LMOM- need active phone number for pt.

## 2016-06-04 NOTE — Telephone Encounter (Signed)
Spoke with pt in reference to hematuria. Pt stated that he saw gross hematuria over the weekend. Pt u/a was negative in clinic. Therefore per Dr. Berneice HeinrichManny dictation and Carollee HerterShannon hematuria workup has been ordered. Made pt aware. Pt voiced understanding.  CT ordered. Can you make cysto appt?

## 2016-06-11 NOTE — Telephone Encounter (Signed)
done

## 2016-06-18 ENCOUNTER — Ambulatory Visit (HOSPITAL_COMMUNITY): Payer: BLUE CROSS/BLUE SHIELD

## 2016-06-23 NOTE — Telephone Encounter (Signed)
Patient does not want to go to a [facility] facility to have his CT scan done because he said he can save $100.00 by going to [facility] and wants an order to have it done there. Can I get an order for the CT scan written so that he can go there to have his CT scan done.  Thanks,  Marcelino Duster

## 2016-06-24 NOTE — Telephone Encounter (Signed)
Spoke with pt in reference to CT being performed at [facility] and possible risk. Pt voiced understanding and stated he would prefer to have CT done at [facility] now. Reinforced with pt to call and reschedule CT appt before 4/30. Pt voiced understanding.

## 2016-06-24 NOTE — Telephone Encounter (Signed)
Please let the patient know that if he chooses to go to a Non - [facility] facility to have his imaging study performed, he may run the risk of [place] not being able to access the images so that we can review them.  Even if the facility can print a disc, we may not be able to manipulate the images on the disc to make measurements or change the views as needed to prepare for a surgical procedure if needed.

## 2016-06-25 ENCOUNTER — Ambulatory Visit (HOSPITAL_COMMUNITY): Admission: RE | Admit: 2016-06-25 | Payer: BLUE CROSS/BLUE SHIELD | Source: Ambulatory Visit

## 2016-07-01 ENCOUNTER — Ambulatory Visit (HOSPITAL_COMMUNITY)
Admission: RE | Admit: 2016-07-01 | Discharge: 2016-07-01 | Disposition: A | Discharge status: Home / Self Care | Payer: BLUE CROSS/BLUE SHIELD | Source: Ambulatory Visit | Attending: Urology | Admitting: Urology

## 2016-07-01 ENCOUNTER — Encounter (HOSPITAL_COMMUNITY): Payer: Self-pay

## 2016-07-01 DIAGNOSIS — R310 Gross hematuria: Secondary | ICD-10-CM | POA: Insufficient documentation

## 2016-07-01 DIAGNOSIS — R319 Hematuria, unspecified: Secondary | ICD-10-CM | POA: Diagnosis not present

## 2016-07-01 MED ORDER — SODIUM CHLORIDE 0.9 % IV SOLN
INTRAVENOUS | Status: AC
  Filled 2016-07-01: qty 250

## 2016-07-01 MED ORDER — IOPAMIDOL (ISOVUE-300) INJECTION 61%
INTRAVENOUS | Status: AC
  Administered 2016-07-01: 100 mL
  Filled 2016-07-01: qty 100

## 2016-07-03 NOTE — Progress Notes (Signed)
Patient called CT department 07/02/16 at approx 12:15 asking about whether or not CT exam or IV insertion could be cause of current left arm and leg tingling. Patient was asked about any redness or swelling at IV site and he denied any of these symptoms. "Selena Batten", who received phone call, told patient both CT and IV would not cause these symptoms (numbness and tingling of left arm and leg) and was advised to call physician with further questioning. Patient was reminded that symptoms were concerning, however, most likely unrelated to anything involved with scan he had the previous day (4/24).

## 2016-07-07 ENCOUNTER — Encounter: Payer: Self-pay | Admitting: Urology

## 2016-07-07 ENCOUNTER — Ambulatory Visit: Payer: BLUE CROSS/BLUE SHIELD | Admitting: Urology

## 2016-07-07 VITALS — BP 132/90 | HR 72 | Ht 71.0 in | Wt 159.2 lb

## 2016-07-07 DIAGNOSIS — N281 Cyst of kidney, acquired: Secondary | ICD-10-CM

## 2016-07-07 DIAGNOSIS — R310 Gross hematuria: Secondary | ICD-10-CM | POA: Diagnosis not present

## 2016-07-07 MED ORDER — CIPROFLOXACIN HCL 500 MG PO TABS
500.0000 mg | ORAL_TABLET | Freq: Once | ORAL | Status: AC
  Administered 2016-07-07: 500 mg via ORAL

## 2016-07-07 MED ORDER — LIDOCAINE HCL 2 % EX GEL
1.0000 | Freq: Once | CUTANEOUS | Status: AC
Start: 2016-07-07 — End: 2016-07-07
  Administered 2016-07-07: 1 via URETHRAL

## 2016-07-07 NOTE — Addendum Note (Signed)
Addended by: Lonna Cobb on: 07/07/2016 04:48 PM   Modules accepted: Orders
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
## 2016-07-08 ENCOUNTER — Telehealth: Payer: Self-pay | Admitting: Primary Care
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
## 2016-07-08 NOTE — Telephone Encounter (Signed)
Pt has appt with Mayra Reel NP 07/09/16 at9:45.

## 2016-07-08 NOTE — Telephone Encounter (Signed)
Noted  

## 2016-07-09 ENCOUNTER — Ambulatory Visit (INDEPENDENT_AMBULATORY_CARE_PROVIDER_SITE_OTHER): Payer: BLUE CROSS/BLUE SHIELD | Admitting: Primary Care

## 2016-07-09 ENCOUNTER — Encounter: Payer: Self-pay | Admitting: Primary Care

## 2016-07-09 VITALS — BP 138/90 | HR 69 | Temp 98.4°F | Wt 159.2 lb

## 2016-07-09 DIAGNOSIS — E0590 Thyrotoxicosis, unspecified without thyrotoxic crisis or storm: Secondary | ICD-10-CM

## 2016-07-09 DIAGNOSIS — M549 Dorsalgia, unspecified: Secondary | ICD-10-CM

## 2016-07-09 DIAGNOSIS — G8929 Other chronic pain: Secondary | ICD-10-CM

## 2016-07-09 DIAGNOSIS — R51 Headache: Secondary | ICD-10-CM

## 2016-07-09 DIAGNOSIS — M542 Cervicalgia: Secondary | ICD-10-CM | POA: Diagnosis not present

## 2016-07-09 DIAGNOSIS — G44329 Chronic post-traumatic headache, not intractable: Secondary | ICD-10-CM | POA: Diagnosis not present

## 2016-07-09 DIAGNOSIS — M50122 Cervical disc disorder at C5-C6 level with radiculopathy: Secondary | ICD-10-CM | POA: Diagnosis not present

## 2016-07-09 DIAGNOSIS — R202 Paresthesia of skin: Secondary | ICD-10-CM | POA: Diagnosis not present

## 2016-07-09 DIAGNOSIS — R519 Headache, unspecified: Secondary | ICD-10-CM | POA: Insufficient documentation

## 2016-07-09 LAB — TSH: TSH: 0.37 u[IU]/mL (ref 0.35–4.50)

## 2016-07-09 LAB — VITAMIN B12: Vitamin B-12: 288 pg/mL (ref 211–911)

## 2016-07-09 LAB — T4, FREE: Free T4: 0.78 ng/dL (ref 0.60–1.60)

## 2016-07-09 MED ORDER — KETOROLAC TROMETHAMINE 60 MG/2ML IM SOLN
60.0000 mg | Freq: Once | INTRAMUSCULAR | Status: AC
  Administered 2016-07-09: 60 mg via INTRAMUSCULAR

## 2016-07-09 NOTE — Assessment & Plan Note (Signed)
Symptoms of radiculopathy suggestive of cervical and possible lumbar spine involvement. No weakness on exam. Neuro exam unremarkable. We'll have him follow back up with the spine center for further evaluation.

## 2016-07-09 NOTE — Patient Instructions (Addendum)
Complete lab work prior to leaving today. I will notify you of your results once received.   Please follow up with the endocrinologist as discussed. Continue to take your medication daily as prescribed.   Stop by the front desk and speak with either Shirlee Limerick or Zella Ball regarding your referral back to the [facility].  You were provided with an injection of Toradol today for your headache.   It was a pleasure to see you today!

## 2016-07-09 NOTE — Progress Notes (Signed)
Subjective:    Patient ID: Eric Shannon, male    DOB: [birth year], 36 y.o.   MRN: [id number]  HPI  Eric Shannon is a 36 year old male with a chief complaint of tingling, headache, neck pain. He has a history of chronic pain to the occipital lobe and neck since his craniotomy years ago. He does have evidence of mild degeneration to C5-C6 from x-ray in 2017.  His tingling is located to the left occipital lobe with radiation down to the left upper extremity, lower back, left hip, and left lower extremity. He has experienced tingling to his neck and left upper extremity before, but his recent episode of symptoms to the hip and lower extremity began after he received contrast for a CT abdomen pelvis that he completed on 07/01/16. The technician did have a tough time getting his IV initiated.   He hit the left side of his head on a metal object 3-4 weeks ago and since then he's experienced a daily headache with intermittent dizziness to the left parietal and temporal lobe.  He describes his headache as dull and throbbing at times.  He's also noticed weakness with burning to the left upper extremity during lifting or repetitive activity that has been chronic for years, worse over the past 3-4 weeks.   He was evaluated at the [facility] in October 2017, underwent injection to the cervical spine which didn't help to relieve symptoms. He has not followed up with the spine center since his injection in October 2017.   He's not taken anything OTC for his symptoms. He also recently re-started his methimazole 5 mg on a daily basis. Prior to one week ago he was taking this medication infrequently. He has not seen his endocrinologist since May 2017.  Review of Systems  Constitutional: Negative for fatigue.  Eyes: Negative for visual disturbance.  Respiratory: Negative for shortness of breath.   Cardiovascular: Negative for chest pain.  Musculoskeletal: Positive for back pain and neck pain.    Neurological: Positive for dizziness, numbness and headaches.       Past Medical History:  Diagnosis Date  . Asthma   . Chickenpox   . Elevated blood pressure   . Migraine      Social History   Social History  . Marital status: Single    Spouse name: N/A  . Number of children: N/A  . Years of education: N/A   Occupational History  . Not on file.   Social History Main Topics  . Smoking status: Former Smoker    Quit date: 04/05/1999  . Smokeless tobacco: Never Used  . Alcohol use 0.0 oz/week     Comment: social  . Drug use: No  . Sexual activity: Not on file   Other Topics Concern  . Not on file   Social History Narrative   Single.   1 daughter.   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   Enjoys spending time with his daughter.    No past surgical history on file.  Family History  Problem Relation Age of Onset  . Hypertension Father   . Hypertension Mother   . Hyperthyroidism Mother     No Known Allergies  Current Outpatient Prescriptions on File Prior to Visit  Medication Sig Dispense Refill  . gabapentin (NEURONTIN) 100 MG capsule Take one capsule at bedtime for a week then increase to one capsule twice a day 60 capsule 3  . meloxicam (MOBIC) 15 MG tablet  Take 1 tablet (15 mg total) by mouth daily. 30 tablet 1  . metaxalone (SKELAXIN) 800 MG tablet Take 800 mg by mouth 3 (three) times daily.    . methimazole (TAPAZOLE) 5 MG tablet Take 5 mg by mouth 3 (three) times daily.    . naproxen (NAPROSYN) 500 MG tablet Take 500 mg by mouth 2 (two) times daily with a meal.     No current facility-administered medications on file prior to visit.     BP 138/90 (BP Location: Right Arm, Patient Position: Sitting, Cuff Size: Normal)   Pulse 69   Temp 98.4 F (36.9 C) (Oral)   Wt 159 lb 4 oz (72.2 kg)   SpO2 96%   BMI 22.21 kg/m    Objective:   Physical Exam  Constitutional: He is oriented to person, place, and time. He appears well-nourished.  Neck: Neck  supple.  Cardiovascular: Normal rate and regular rhythm.   Pulmonary/Chest: Effort normal and breath sounds normal.  Musculoskeletal: Normal range of motion.  5/5 strength to upper and lower extremities bilaterally.  Neurological: He is alert and oriented to person, place, and time. He has normal reflexes. No cranial nerve deficit. Coordination normal.  Skin: Skin is warm and dry.          Assessment & Plan:

## 2016-07-09 NOTE — Assessment & Plan Note (Signed)
No improvement with injection in October 2017. Symptoms today likely secondary to cervical disc disease. Will have him follow back up with the spine center at his earliest convenience. Exam today stable.

## 2016-07-09 NOTE — Assessment & Plan Note (Signed)
Chronic since craniotomy in 2005, occipital region. Recent symptoms likely postconcussive from interaction with a metal object. Neuro exam unremarkable, no suspicion for bleed at this point. IM Toradol provided in office today. Headache could also be aggravated from cervical disc disorder. He will update if no improvement in headache and dizziness.

## 2016-07-09 NOTE — Progress Notes (Signed)
Pre visit review using our clinic review tool, if applicable. No additional management support is needed unless otherwise documented below in the visit note. 

## 2016-07-09 NOTE — Assessment & Plan Note (Signed)
Inconsistent use of medication for months, stressed the importance of daily adherence. Check TSH and free T4 today. Strongly encouraged him to follow up with his endocrinologist for reevaluation.

## 2016-07-16 DIAGNOSIS — M5412 Radiculopathy, cervical region: Secondary | ICD-10-CM | POA: Diagnosis not present

## 2016-07-24 DIAGNOSIS — M4312 Spondylolisthesis, cervical region: Secondary | ICD-10-CM | POA: Diagnosis not present

## 2016-07-24 DIAGNOSIS — M50222 Other cervical disc displacement at C5-C6 level: Secondary | ICD-10-CM | POA: Diagnosis not present

## 2016-07-24 DIAGNOSIS — M5412 Radiculopathy, cervical region: Secondary | ICD-10-CM | POA: Insufficient documentation

## 2016-07-24 DIAGNOSIS — M5020 Other cervical disc displacement, unspecified cervical region: Secondary | ICD-10-CM | POA: Diagnosis not present

## 2016-07-25 ENCOUNTER — Ambulatory Visit
Admission: RE | Admit: 2016-07-25 | Discharge: 2016-07-25 | Disposition: A | Discharge status: Home / Self Care | Payer: BLUE CROSS/BLUE SHIELD | Source: Ambulatory Visit | Attending: Urology | Admitting: Urology

## 2016-07-25 DIAGNOSIS — N281 Cyst of kidney, acquired: Secondary | ICD-10-CM | POA: Diagnosis not present

## 2016-07-25 DIAGNOSIS — R310 Gross hematuria: Secondary | ICD-10-CM | POA: Diagnosis not present

## 2016-07-25 DIAGNOSIS — N1330 Unspecified hydronephrosis: Secondary | ICD-10-CM | POA: Diagnosis not present

## 2016-07-29 ENCOUNTER — Telehealth: Payer: Self-pay

## 2016-07-29 DIAGNOSIS — M5020 Other cervical disc displacement, unspecified cervical region: Secondary | ICD-10-CM | POA: Diagnosis not present

## 2016-07-29 DIAGNOSIS — G9619 Other disorders of meninges, not elsewhere classified: Secondary | ICD-10-CM | POA: Diagnosis not present

## 2016-07-29 DIAGNOSIS — M4712 Other spondylosis with myelopathy, cervical region: Secondary | ICD-10-CM | POA: Diagnosis not present

## 2016-07-29 DIAGNOSIS — M5010 Cervical disc disorder with radiculopathy, unspecified cervical region: Secondary | ICD-10-CM | POA: Diagnosis not present

## 2016-07-29 DIAGNOSIS — M4722 Other spondylosis with radiculopathy, cervical region: Secondary | ICD-10-CM | POA: Diagnosis not present

## 2016-07-29 DIAGNOSIS — G96198 Other disorders of meninges, not elsewhere classified: Secondary | ICD-10-CM | POA: Insufficient documentation

## 2016-07-29 NOTE — Telephone Encounter (Signed)
Eric FieldEskridge, Matthew, MD  Skeet LatchWatkins, Storie Heffern C, LPN        Notify patient his renal us looked OK -- no mass or cyst was noted. Have him return in 6 months for an OV and repeat UA. Thanks.    Spoke with pt in reference to RUS results and needing a f/u. Pt voiced understanding.

## 2016-07-30 DIAGNOSIS — E0590 Thyrotoxicosis, unspecified without thyrotoxic crisis or storm: Secondary | ICD-10-CM | POA: Diagnosis not present

## 2016-09-08 DIAGNOSIS — N390 Urinary tract infection, site not specified: Secondary | ICD-10-CM | POA: Diagnosis not present

## 2016-09-08 DIAGNOSIS — Z01818 Encounter for other preprocedural examination: Secondary | ICD-10-CM | POA: Diagnosis not present

## 2016-09-08 DIAGNOSIS — I10 Essential (primary) hypertension: Secondary | ICD-10-CM | POA: Diagnosis not present

## 2016-09-08 DIAGNOSIS — M50122 Cervical disc disorder at C5-C6 level with radiculopathy: Secondary | ICD-10-CM | POA: Diagnosis not present

## 2016-09-08 DIAGNOSIS — E0590 Thyrotoxicosis, unspecified without thyrotoxic crisis or storm: Secondary | ICD-10-CM | POA: Diagnosis not present

## 2016-09-08 DIAGNOSIS — Z8669 Personal history of other diseases of the nervous system and sense organs: Secondary | ICD-10-CM | POA: Diagnosis not present

## 2016-09-23 DIAGNOSIS — Z981 Arthrodesis status: Secondary | ICD-10-CM | POA: Diagnosis not present

## 2016-09-23 DIAGNOSIS — E0590 Thyrotoxicosis, unspecified without thyrotoxic crisis or storm: Secondary | ICD-10-CM | POA: Diagnosis not present

## 2016-09-23 DIAGNOSIS — M50122 Cervical disc disorder at C5-C6 level with radiculopathy: Secondary | ICD-10-CM | POA: Diagnosis not present

## 2016-09-23 DIAGNOSIS — Z79899 Other long term (current) drug therapy: Secondary | ICD-10-CM | POA: Diagnosis not present

## 2016-09-23 DIAGNOSIS — M4722 Other spondylosis with radiculopathy, cervical region: Secondary | ICD-10-CM | POA: Diagnosis not present

## 2016-09-23 DIAGNOSIS — Z87891 Personal history of nicotine dependence: Secondary | ICD-10-CM | POA: Diagnosis not present

## 2016-09-23 DIAGNOSIS — J4520 Mild intermittent asthma, uncomplicated: Secondary | ICD-10-CM | POA: Diagnosis not present

## 2016-09-23 DIAGNOSIS — R0789 Other chest pain: Secondary | ICD-10-CM | POA: Diagnosis not present

## 2016-09-23 DIAGNOSIS — M4802 Spinal stenosis, cervical region: Secondary | ICD-10-CM | POA: Diagnosis not present

## 2016-09-23 DIAGNOSIS — M4712 Other spondylosis with myelopathy, cervical region: Secondary | ICD-10-CM | POA: Diagnosis not present

## 2016-09-23 DIAGNOSIS — M5412 Radiculopathy, cervical region: Secondary | ICD-10-CM | POA: Diagnosis not present

## 2016-09-23 HISTORY — PX: OTHER SURGICAL HISTORY: SHX169

## 2016-09-24 DIAGNOSIS — M4712 Other spondylosis with myelopathy, cervical region: Secondary | ICD-10-CM | POA: Diagnosis not present

## 2016-09-24 DIAGNOSIS — Z87891 Personal history of nicotine dependence: Secondary | ICD-10-CM | POA: Diagnosis not present

## 2016-09-24 DIAGNOSIS — R0789 Other chest pain: Secondary | ICD-10-CM | POA: Diagnosis not present

## 2016-09-24 DIAGNOSIS — M5412 Radiculopathy, cervical region: Secondary | ICD-10-CM | POA: Diagnosis not present

## 2016-09-24 DIAGNOSIS — M50122 Cervical disc disorder at C5-C6 level with radiculopathy: Secondary | ICD-10-CM | POA: Diagnosis not present

## 2016-09-24 DIAGNOSIS — J4520 Mild intermittent asthma, uncomplicated: Secondary | ICD-10-CM | POA: Diagnosis not present

## 2016-09-24 DIAGNOSIS — M4802 Spinal stenosis, cervical region: Secondary | ICD-10-CM | POA: Diagnosis not present

## 2016-09-24 DIAGNOSIS — Z79899 Other long term (current) drug therapy: Secondary | ICD-10-CM | POA: Diagnosis not present

## 2016-09-24 DIAGNOSIS — E0590 Thyrotoxicosis, unspecified without thyrotoxic crisis or storm: Secondary | ICD-10-CM | POA: Diagnosis not present

## 2016-09-30 DIAGNOSIS — Z981 Arthrodesis status: Secondary | ICD-10-CM | POA: Diagnosis not present

## 2016-09-30 DIAGNOSIS — M5412 Radiculopathy, cervical region: Secondary | ICD-10-CM | POA: Diagnosis not present

## 2016-09-30 DIAGNOSIS — M4722 Other spondylosis with radiculopathy, cervical region: Secondary | ICD-10-CM | POA: Diagnosis not present

## 2016-09-30 DIAGNOSIS — M4712 Other spondylosis with myelopathy, cervical region: Secondary | ICD-10-CM | POA: Diagnosis not present

## 2016-10-02 ENCOUNTER — Ambulatory Visit (INDEPENDENT_AMBULATORY_CARE_PROVIDER_SITE_OTHER): Payer: BLUE CROSS/BLUE SHIELD | Admitting: Primary Care

## 2016-10-02 ENCOUNTER — Encounter: Payer: Self-pay | Admitting: Primary Care

## 2016-10-02 VITALS — BP 136/82 | HR 94 | Temp 98.2°F | Wt 154.1 lb

## 2016-10-02 DIAGNOSIS — R8290 Unspecified abnormal findings in urine: Secondary | ICD-10-CM

## 2016-10-02 DIAGNOSIS — E0590 Thyrotoxicosis, unspecified without thyrotoxic crisis or storm: Secondary | ICD-10-CM | POA: Diagnosis not present

## 2016-10-02 LAB — POC URINALSYSI DIPSTICK (AUTOMATED)
BILIRUBIN UA: NEGATIVE
GLUCOSE UA: NEGATIVE
Ketones, UA: NEGATIVE
Leukocytes, UA: NEGATIVE
NITRITE UA: NEGATIVE
PH UA: 6 (ref 5.0–8.0)
Protein, UA: NEGATIVE
RBC UA: NEGATIVE
SPEC GRAV UA: 1.015 (ref 1.010–1.025)
UROBILINOGEN UA: 0.2 U/dL

## 2016-10-02 NOTE — Progress Notes (Signed)
Subjective:    Patient ID: Eric Shannon, male    DOB: 09/04/1980, 36 y.o.   MRN: [id number]  HPI  [name] is a 36 year old male with a history of cervical spine spondylosis with myelopathy and stenosis who presents today for hospital follow up.   He underwent C5-6 cervical discectomy with structural allograft on 09/23/16 [facility]. He tolerated the procedure well and quickly recovered from general anesthesia. His follow up xray prior to discharge was with stable hardware and no complications. [facility] he reported chest pain. His ECG and serial troponin levels were unremarkable. His HEART score was 0-1. He was discharged home on 09/24/16.   He followed up with his neurosurgeon on 09/30/16 with complaints of difficulty swallowing, increased sputum production, neck pain, and discomfort with sleeping at night. He also endorsed foul smelling urine and penile swelling since removal of foley catheter. He was cleared to transition to his soft collar and encouraged to refrain from products that would increase sputum production. His suture was removed and steri-strips left in place. He was advised to follow up with PCP or Urgent Care for urinary symptoms to rule out UTI.  He's noticed foul smelling urine, suprapubic discomfort, difficulty emptying his bladder, and burning to the tip of his penis only with erection. He denies hematuria, fevers. He first noticed these symptoms begin several days after his surgery. Overall his symptoms have improved.  Review of Systems  Constitutional: Negative for fever.  Gastrointestinal: Negative for abdominal pain.  Genitourinary: Negative for dysuria, flank pain, frequency and hematuria.       Foul smelling urine, suprapubic discomfort, burning to tip of penis with erection.        Past Medical History:  Diagnosis Date  . Asthma   . Chickenpox   . Elevated blood pressure   . Migraine      Social History   Social History  .  Marital status: Single    Spouse name: N/A  . Number of children: N/A  . Years of education: N/A   Occupational History  . Not on file.   Social History Main Topics  . Smoking status: Former Smoker    Quit date: 04/05/1999  . Smokeless tobacco: Never Used  . Alcohol use 0.0 oz/week     Comment: social  . Drug use: No  . Sexual activity: Not on file   Other Topics Concern  . Not on file   Social History Narrative   Single.   1 daughter.   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   Enjoys spending time with his daughter.    No past surgical history on file.  Family History  Problem Relation Age of Onset  . Hypertension Father   . Hypertension Mother   . Hyperthyroidism Mother     No Known Allergies  Current Outpatient Prescriptions on File Prior to Visit  Medication Sig Dispense Refill  . metaxalone (SKELAXIN) 800 MG tablet Take 800 mg by mouth 3 (three) times daily.    . naproxen (NAPROSYN) 500 MG tablet Take 500 mg by mouth 2 (two) times daily with a meal.    . gabapentin (NEURONTIN) 100 MG capsule Take one capsule at bedtime for a week then increase to one capsule twice a day (Patient not taking: Reported on 10/02/2016) 60 capsule 3  . meloxicam (MOBIC) 15 MG tablet Take 1 tablet (15 mg total) by mouth daily. (Patient not taking: Reported on 10/02/2016)  30 tablet 1  . methimazole (TAPAZOLE) 5 MG tablet Take 5 mg by mouth 3 (three) times daily.     No current facility-administered medications on file prior to visit.     BP 136/82   Pulse 94   Temp 98.2 F (36.8 C) (Oral)   Wt 154 lb 1.9 oz (69.9 kg)   SpO2 99%   BMI 21.50 kg/m    Objective:   Physical Exam  Constitutional: He appears well-nourished. He does not appear ill.  Neck: Neck supple.  Cardiovascular: Normal rate and regular rhythm.   Pulmonary/Chest: Effort normal and breath sounds normal.  Abdominal: Soft. There is no tenderness. There is no CVA tenderness.  Skin: Skin is warm and dry.           Assessment & Plan:  Foul Smelling Urine:  Also with penile burning at tip only with erection. Exam today unremarkable. UA without evidence of leuks, nitrites, blood. Will send culture to confirm. Suspect symptoms related to internal trauma from foley catheter. Discussed to use Ibuprofen PRN for inflammation. Reassuring that symptoms are improving.  [facility] surgical and follow up notes reviewed.  Morrie Sheldonlark,Katherine Kendal, NP

## 2016-10-02 NOTE — Patient Instructions (Signed)
Your urine does not show evidence of infection.  Your symptoms are likely related to irritation from the catheter which should resolve within a few days. You may take the Meloxicam or Ibuprofen to help with inflammation.   It was a pleasure to see you today!

## 2016-10-03 LAB — URINE CULTURE: Organism ID, Bacteria: NO GROWTH

## 2016-10-06 ENCOUNTER — Ambulatory Visit: Payer: BLUE CROSS/BLUE SHIELD | Admitting: Primary Care

## 2016-10-08 ENCOUNTER — Ambulatory Visit: Payer: BLUE CROSS/BLUE SHIELD | Admitting: Urology

## 2016-10-08 ENCOUNTER — Encounter: Payer: Self-pay | Admitting: Urology

## 2016-10-08 VITALS — BP 118/80 | HR 97 | Ht 71.0 in | Wt 151.1 lb

## 2016-10-08 DIAGNOSIS — R310 Gross hematuria: Secondary | ICD-10-CM
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
## 2016-10-09 DIAGNOSIS — E0590 Thyrotoxicosis, unspecified without thyrotoxic crisis or storm: Secondary | ICD-10-CM | POA: Diagnosis not present

## 2016-10-14 DIAGNOSIS — M5020 Other cervical disc displacement, unspecified cervical region: Secondary | ICD-10-CM | POA: Diagnosis not present

## 2016-10-14 DIAGNOSIS — Z981 Arthrodesis status: Secondary | ICD-10-CM | POA: Diagnosis not present

## 2016-10-15 ENCOUNTER — Ambulatory Visit (INDEPENDENT_AMBULATORY_CARE_PROVIDER_SITE_OTHER): Payer: BLUE CROSS/BLUE SHIELD | Admitting: Primary Care

## 2016-10-15 ENCOUNTER — Ambulatory Visit (INDEPENDENT_AMBULATORY_CARE_PROVIDER_SITE_OTHER)
Admission: RE | Admit: 2016-10-15 | Discharge: 2016-10-15 | Disposition: A | Discharge status: Home / Self Care | Payer: BLUE CROSS/BLUE SHIELD | Source: Ambulatory Visit | Attending: Primary Care | Admitting: Primary Care

## 2016-10-15 ENCOUNTER — Encounter: Payer: Self-pay | Admitting: Primary Care

## 2016-10-15 VITALS — BP 118/80 | HR 79 | Temp 98.6°F | Ht 71.0 in | Wt 152.8 lb

## 2016-10-15 DIAGNOSIS — R059 Cough, unspecified: Secondary | ICD-10-CM

## 2016-10-15 DIAGNOSIS — R0982 Postnasal drip: Secondary | ICD-10-CM | POA: Diagnosis not present

## 2016-10-15 DIAGNOSIS — R05 Cough: Secondary | ICD-10-CM

## 2016-10-15 MED ORDER — CETIRIZINE HCL 10 MG PO TABS: ORAL_TABLET | ORAL | 0 refills | Status: DC

## 2016-10-15 NOTE — Patient Instructions (Signed)
Complete xray(s) prior to leaving today. I will notify you of your results once received.  Start cetirizine 10 mg tablets for throat drainage and cough. Take 1 tablet by mouth at bedtime every evening.  Please notify me if no improvement in your drainage and cough in 10 days.   It was a pleasure to see you today!

## 2016-10-15 NOTE — Progress Notes (Signed)
Subjective:    Patient ID: Eric Shannon, male    DOB: 06-26-1980, 36 y.o.   MRN: [id number]  HPI  Eric Shannon is a 36 year old male s/p cervical spinal fusion surgery 3-4 weeks ago and asthma who presents today with a chief complaint of throat drainage. He also reports cough, chest tightness, rhinorrhea, chest congestion. The throat drainage with clearing has been present for years. He saw the PA yesterday at the neurosurgery office who told him to follow up here for post nasal drip. His cough has been present since his surgery. He was intubated and on a ventilator during the surgery. He denies fevers, sore throat, esophageal burning, wheezing, epigastric pain. He's never taking anything for his symptoms in the past or recently. Overall he feels well.   Review of Systems  Constitutional: Negative for chills, fatigue and fever.  HENT: Positive for postnasal drip and rhinorrhea. Negative for congestion, ear pain, sinus pressure and sore throat.   Respiratory: Positive for cough. Negative for shortness of breath.   Cardiovascular: Negative for chest pain.       Past Medical History:  Diagnosis Date  . Asthma   . Chickenpox   . Elevated blood pressure   . Migraine      Social History   Social History  . Marital status: Single    Spouse name: N/A  . Number of children: N/A  . Years of education: N/A   Occupational History  . Not on file.   Social History Main Topics  . Smoking status: Former Smoker    Quit date: 04/05/1999  . Smokeless tobacco: Never Used  . Alcohol use 0.0 oz/week     Comment: social  . Drug use: No  . Sexual activity: Not on file   Other Topics Concern  . Not on file   Social History Narrative   Single.   1 daughter.   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   Enjoys spending time with his daughter.    No past surgical history on file.  Family History  Problem Relation Age of Onset  . Hypertension Father   . Hypertension Mother   .  Hyperthyroidism Mother     No Known Allergies  Current Outpatient Prescriptions on File Prior to Visit  Medication Sig Dispense Refill  . metaxalone (SKELAXIN) 800 MG tablet Take 800 mg by mouth 3 (three) times daily.    . methimazole (TAPAZOLE) 5 MG tablet Take 5 mg by mouth 3 (three) times daily.    . methocarbamol (ROBAXIN) 500 MG tablet Take 500 mg by mouth at bedtime.     . naproxen (NAPROSYN) 500 MG tablet Take 500 mg by mouth 2 (two) times daily with a meal.    . oxyCODONE (OXY IR/ROXICODONE) 5 MG immediate release tablet TAKE 1 TO 2 TABLETS BY MOUTH EVERY 4 HOURS AS NEEDED FOR PAIN FOR UP TO 7 DAYS  0  . gabapentin (NEURONTIN) 100 MG capsule Take one capsule at bedtime for a week then increase to one capsule twice a day (Patient not taking: Reported on 10/02/2016) 60 capsule 3  . meloxicam (MOBIC) 15 MG tablet Take 1 tablet (15 mg total) by mouth daily. (Patient not taking: Reported on 10/02/2016) 30 tablet 1   No current facility-administered medications on file prior to visit.     BP 118/80   Pulse 79   Temp 98.6 F (37 C) (Oral)   Ht 5\' 11"  (1.803 m)   Wt  152 lb 12.8 oz (69.3 kg)   SpO2 99%   BMI 21.31 kg/m    Objective:   Physical Exam  Constitutional: He appears well-nourished. He does not appear ill.  HENT:  Right Ear: Tympanic membrane and ear canal normal.  Left Ear: Tympanic membrane and ear canal normal.  Nose: No mucosal edema. Right sinus exhibits no maxillary sinus tenderness and no frontal sinus tenderness. Left sinus exhibits no maxillary sinus tenderness and no frontal sinus tenderness.  Mouth/Throat: Mucous membranes are normal. Posterior oropharyngeal erythema present. No oropharyngeal exudate or posterior oropharyngeal edema.  Minor irritation/erythema to throat.  Eyes: Conjunctivae are normal.  Neck: Neck supple.  Cardiovascular: Normal rate and regular rhythm.   Pulmonary/Chest: Effort normal and breath sounds normal. He has no wheezes. He has no  rales.  Skin: Skin is warm and dry.          Assessment & Plan:  Post Nasal Drip:  Chronic for years.  Likely secondary to allergies, never taken anything for symptoms in the past. Will start with OTC Zyrtec HS.  If no improvement then consider Singular given childhood history of asthma. He will update in 10 days if no improvement.  Cough:  Present for 3 weeks since surgery. Exam unremarkable. No wheezing or rhonchi. Check chest xray today to rule out ventilator associated pneumonia as he was intubated for surgical procedure.  Could be allergies, GERD, Asthma. Will rule out pneumonia fist. Also start Zyrtec for presumed allergies. If no improvement then consider Singulair for asthma/allergies, albuterol, or omeprazole for silent reflux.  Morrie Sheldonlark,France Lusty Kendal, NP

## 2016-10-24 ENCOUNTER — Telehealth: Payer: Self-pay | Admitting: Primary Care

## 2016-10-24 MED ORDER — MONTELUKAST SODIUM 10 MG PO TABS: 10.0000 mg | ORAL_TABLET | Freq: Every day | ORAL | 0 refills | Status: DC

## 2016-10-24 NOTE — Telephone Encounter (Signed)
Patient stated that he did try Zyrtec.   Will send Singulair for patient.

## 2016-10-24 NOTE — Telephone Encounter (Signed)
Has he started the Zyrtec? If so, and it is not effective, Jae Dire wanted to start Singulair daily.

## 2016-10-24 NOTE — Addendum Note (Signed)
Addended by: Tawnya Crook on: 10/24/2016 01:25 PM   Modules accepted: Orders

## 2016-10-24 NOTE — Telephone Encounter (Signed)
Patient called and requesting x-rays results.  Spoken and notified patient of Kate's comments of the x-ray.  Patient stated that he is not better and wanted to know what he should do

## 2016-10-24 NOTE — Telephone Encounter (Signed)
Opened in error

## 2016-11-03 ENCOUNTER — Telehealth: Payer: Self-pay

## 2016-11-03 NOTE — Telephone Encounter (Signed)
Patient advised.

## 2016-11-03 NOTE — Telephone Encounter (Signed)
Pt left v/m;pt seen 10/15/16; pt still coughing and for 1 week every time pt eats he vomits. Pt request cb with that to do.

## 2016-11-03 NOTE — Telephone Encounter (Signed)
Please have him start omeprazole 20 mg capsules for potential acid reflux. Take 1 capsule by mouth every day. These can be purchased over-the-counter. Please have him update [place] if no improvement in 1-2 weeks.

## 2016-11-12 DIAGNOSIS — G9619 Other disorders of meninges, not elsewhere classified: Secondary | ICD-10-CM | POA: Diagnosis not present

## 2016-11-12 DIAGNOSIS — Z981 Arthrodesis status: Secondary | ICD-10-CM | POA: Diagnosis not present

## 2016-11-14 ENCOUNTER — Other Ambulatory Visit: Payer: Self-pay | Admitting: Neurosurgery

## 2016-11-14 DIAGNOSIS — G9619 Other disorders of meninges, not elsewhere classified: Principal | ICD-10-CM

## 2016-11-14 DIAGNOSIS — G96198 Other disorders of meninges, not elsewhere classified: Secondary | ICD-10-CM

## 2016-11-14 DIAGNOSIS — Z981 Arthrodesis status: Secondary | ICD-10-CM

## 2016-11-24 ENCOUNTER — Ambulatory Visit: Payer: BLUE CROSS/BLUE SHIELD

## 2016-12-01 ENCOUNTER — Ambulatory Visit
Admission: RE | Admit: 2016-12-01 | Discharge: 2016-12-01 | Disposition: A | Discharge status: Home / Self Care | Payer: BLUE CROSS/BLUE SHIELD | Source: Ambulatory Visit | Attending: Neurosurgery | Admitting: Neurosurgery

## 2016-12-01 DIAGNOSIS — M8568 Other cyst of bone, other site: Secondary | ICD-10-CM | POA: Insufficient documentation

## 2016-12-01 DIAGNOSIS — Z981 Arthrodesis status: Secondary | ICD-10-CM

## 2016-12-01 DIAGNOSIS — G9619 Other disorders of meninges, not elsewhere classified: Secondary | ICD-10-CM | POA: Diagnosis not present

## 2016-12-01 DIAGNOSIS — G96198 Other disorders of meninges, not elsewhere classified: Secondary | ICD-10-CM

## 2016-12-01 DIAGNOSIS — M546 Pain in thoracic spine: Secondary | ICD-10-CM | POA: Diagnosis not present

## 2016-12-01 DIAGNOSIS — M5124 Other intervertebral disc displacement, thoracic region: Secondary | ICD-10-CM | POA: Insufficient documentation

## 2016-12-03 DIAGNOSIS — Z131 Encounter for screening for diabetes mellitus: Secondary | ICD-10-CM | POA: Diagnosis not present

## 2016-12-03 DIAGNOSIS — Z13220 Encounter for screening for lipoid disorders: Secondary | ICD-10-CM | POA: Diagnosis not present

## 2016-12-03 DIAGNOSIS — Z136 Encounter for screening for cardiovascular disorders: Secondary | ICD-10-CM | POA: Diagnosis not present

## 2016-12-03 DIAGNOSIS — Z713 Dietary counseling and surveillance: Secondary | ICD-10-CM | POA: Diagnosis not present

## 2016-12-11 ENCOUNTER — Encounter: Payer: BLUE CROSS/BLUE SHIELD | Admitting: Primary Care

## 2016-12-15 ENCOUNTER — Ambulatory Visit (INDEPENDENT_AMBULATORY_CARE_PROVIDER_SITE_OTHER): Payer: BLUE CROSS/BLUE SHIELD | Admitting: Primary Care

## 2016-12-15 ENCOUNTER — Encounter: Payer: Self-pay | Admitting: Primary Care

## 2016-12-15 ENCOUNTER — Other Ambulatory Visit: Payer: Self-pay | Admitting: Primary Care

## 2016-12-15 VITALS — BP 124/78 | HR 82 | Temp 98.3°F | Ht 71.0 in | Wt 150.8 lb

## 2016-12-15 DIAGNOSIS — M542 Cervicalgia: Secondary | ICD-10-CM | POA: Diagnosis not present

## 2016-12-15 DIAGNOSIS — M549 Dorsalgia, unspecified: Secondary | ICD-10-CM | POA: Diagnosis not present

## 2016-12-15 DIAGNOSIS — Z0000 Encounter for general adult medical examination without abnormal findings: Secondary | ICD-10-CM | POA: Diagnosis not present

## 2016-12-15 DIAGNOSIS — G8929 Other chronic pain: Secondary | ICD-10-CM

## 2016-12-15 DIAGNOSIS — R739 Hyperglycemia, unspecified: Secondary | ICD-10-CM

## 2016-12-15 DIAGNOSIS — M50122 Cervical disc disorder at C5-C6 level with radiculopathy: Secondary | ICD-10-CM | POA: Diagnosis not present

## 2016-12-15 DIAGNOSIS — B360 Pityriasis versicolor: Secondary | ICD-10-CM | POA: Diagnosis not present

## 2016-12-15 DIAGNOSIS — E0590 Thyrotoxicosis, unspecified without thyrotoxic crisis or storm: Secondary | ICD-10-CM

## 2016-12-15 LAB — COMPREHENSIVE METABOLIC PANEL
ALBUMIN: 4.3 g/dL (ref 3.5–5.2)
ALK PHOS: 72 U/L (ref 39–117)
ALT: 13 U/L (ref 0–53)
AST: 12 U/L (ref 0–37)
BILIRUBIN TOTAL: 0.7 mg/dL (ref 0.2–1.2)
BUN: 12 mg/dL (ref 6–23)
CALCIUM: 9.3 mg/dL (ref 8.4–10.5)
CO2: 30 mEq/L (ref 19–32)
CREATININE: 1.07 mg/dL (ref 0.40–1.50)
Chloride: 103 mEq/L (ref 96–112)
GFR: 101.35 mL/min (ref >60.00)
Glucose, Bld: 107 mg/dL — ABNORMAL HIGH (ref 70–99)
Potassium: 4.6 mEq/L (ref 3.5–5.1)
Sodium: 139 mEq/L (ref 135–145)
TOTAL PROTEIN: 6.8 g/dL (ref 6.0–8.3)

## 2016-12-15 LAB — LIPID PANEL
Cholesterol: 157 mg/dL (ref 0–200)
HDL: 78.2 mg/dL
LDL Cholesterol: 68 mg/dL (ref 0–99)
NonHDL: 78.76
Total CHOL/HDL Ratio: 2
Triglycerides: 54 mg/dL (ref 0.0–149.0)
VLDL: 10.8 mg/dL (ref 0.0–40.0)

## 2016-12-15 MED ORDER — FLUCONAZOLE 150 MG PO TABS: ORAL_TABLET | ORAL | 0 refills | Status: DC

## 2016-12-15 NOTE — Assessment & Plan Note (Signed)
Chronic since childhood, no improvement with Rx shampoos and OTC products. Rx for fluconazole 300 mg weekly for 2 weeks.

## 2016-12-15 NOTE — Assessment & Plan Note (Signed)
Td UTD, declines influenza. Recommended to gradually work on exercise. Increase vegetables, fruit, whole grains, water. Exam overall unremarkable. Labs pending. Follow up in 1 year.

## 2016-12-15 NOTE — Patient Instructions (Signed)
Complete lab work prior to leaving today. I will notify you of your results once received.   Start fluconazole tablets for the rash. Take 2 tablets once weekly for 2 weeks.  Start exercising gradually. You should be getting 150 minutes of moderate intensity exercise weekly.  Ensure you are consuming 64 ounces of water daily.  Follow up in 1 year for your annual exam or sooner if needed.  It was a pleasure to see you today!  Tinea Versicolor Tinea versicolor is a common fungal infection of the skin. It causes a rash that appears as light or dark patches on the skin. The rash most often occurs on the chest, back, neck, or upper arms. This condition is more common during warm weather. Other than affecting how your skin looks, tinea versicolor usually does not cause other problems. In most cases, the infection goes away in a few weeks with treatment. It may take a few months for the patches on your skin to clear up. What are the causes? Tinea versicolor occurs when a type of fungus that is normally present on the skin starts to overgrow. This fungus is a kind of yeast. The exact cause of the overgrowth is not known. This condition cannot be passed from one person to another (noncontagious). What increases the risk? This condition is more likely to develop when certain factors are present, such as:  Heat and humidity.  Sweating too much.  Hormone changes.  Oily skin.  A weak defense (immune) system.  What are the signs or symptoms? Symptoms of this condition may include:  A rash on your skin that is made up of light or dark patches. The rash may have: ? Patches of tan or pink spots on light skin. ? Patches of white or brown spots on dark skin. ? Patches of skin that do not tan. ? Well-marked edges. ? Scales on the discolored areas.  Mild itching.  How is this diagnosed? A health care provider can usually diagnose this condition by looking at your skin. During the exam, he or she  may use ultraviolet light to help determine the extent of the infection. In some cases, a skin sample may be taken by scraping the rash. This sample will be viewed under a microscope to check for yeast overgrowth. How is this treated? Treatment for this condition may include:  Dandruff shampoo that is applied to the affected skin during showers or bathing.  Over-the-counter medicated skin cream, lotion, or soaps.  Prescription antifungal medicine in the form of skin cream or pills.  Medicine to help reduce itching.  Follow these instructions at home:  Take medicines only as directed by your health care provider.  Apply dandruff shampoo to the affected area if told to do so by your health care provider. You may be instructed to scrub the affected skin for several minutes each day.  Do not scratch the affected area of skin.  Avoid hot and humid conditions.  Do not use tanning booths.  Try to avoid sweating a lot. Contact a health care provider if:  Your symptoms get worse.  You have a fever.  You have redness, swelling, or pain at the site of your rash.  You have fluid, blood, or pus coming from your rash.  Your rash returns after treatment. This information is not intended to replace advice given to you by your health care provider. Make sure you discuss any questions you have with your health care provider. Document Released: 02/22/2000 Document  Revised: 10/28/2015 Document Reviewed: 12/06/2013 Elsevier Interactive Patient Education  [employer].

## 2016-12-15 NOTE — Assessment & Plan Note (Signed)
Residual since neck surgery this past Summer. Discussed use of PRN Ibuprofen and tylenol.

## 2016-12-15 NOTE — Assessment & Plan Note (Signed)
Overall improved since surgery with some limited ROM in neck. No radiculopathy.

## 2016-12-15 NOTE — Progress Notes (Signed)
Subjective:    Patient ID: Eric Shannon, male    DOB: 1980-03-22, 37 y.o.   MRN: [id number]  HPI  Eric Shannon is a 36 year old male who presents today for complete physical.  Immunizations: -Tetanus: Completed in 2017 -Influenza: Declines   Diet: He endorses a healthy diet.  Breakfast: Fast food Lunch: Fast food, sandwich Dinner: Meat, vegetable starch  Snacks: Cookies Desserts: Daily Beverages: Water, Fruit juice, lemonade, occasional soda  Exercise: Not currently exercising Eye exam: Due Dental exam: Follows annually   Review of Systems  Constitutional: Negative for unexpected weight change.  HENT: Negative for rhinorrhea.   Respiratory: Negative for cough and shortness of breath.   Cardiovascular: Negative for chest pain.  Gastrointestinal: Negative for constipation and diarrhea.  Genitourinary: Negative for difficulty urinating.  Musculoskeletal: Positive for arthralgias and neck pain. Negative for myalgias.  Skin: Positive for rash.       Chronic "yeast" rash for years, no improvement with shampoo that was provided for treatment.  Allergic/Immunologic: Negative for environmental allergies.  Neurological: Negative for dizziness, numbness and headaches.  Psychiatric/Behavioral:       Denies concerns for anxiety and depression       Past Medical History:  Diagnosis Date  . Asthma   . Chickenpox   . Elevated blood pressure   . Migraine      Social History   Social History  . Marital status: Single    Spouse name: N/A  . Number of children: N/A  . Years of education: N/A   Occupational History  . Not on file.   Social History Main Topics  . Smoking status: Former Smoker    Quit date: 04/05/1999  . Smokeless tobacco: Never Used  . Alcohol use 0.0 oz/week     Comment: social  . Drug use: No  . Sexual activity: Not on file   Other Topics Concern  . Not on file   Social History Narrative   Single.   1 daughter.   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   Enjoys spending time with his daughter.    No past surgical history on file.  Family History  Problem Relation Age of Onset  . Hypertension Father   . Hypertension Mother   . Hyperthyroidism Mother     No Known Allergies  Current Outpatient Prescriptions on File Prior to Visit  Medication Sig Dispense Refill  . cetirizine (ZYRTEC) 10 MG tablet Take 1 tablet by mouth every night at bedtime for post nasal drip. 90 tablet 0  . methimazole (TAPAZOLE) 5 MG tablet Take 5 mg by mouth 3 (three) times daily.    . montelukast (SINGULAIR) 10 MG tablet Take 1 tablet (10 mg total) by mouth at bedtime. 30 tablet 0  . gabapentin (NEURONTIN) 100 MG capsule Take one capsule at bedtime for a week then increase to one capsule twice a day (Patient not taking: Reported on 10/02/2016) 60 capsule 3  . meloxicam (MOBIC) 15 MG tablet Take 1 tablet (15 mg total) by mouth daily. (Patient not taking: Reported on 10/02/2016) 30 tablet 1  . metaxalone (SKELAXIN) 800 MG tablet Take 800 mg by mouth 3 (three) times daily.    . methocarbamol (ROBAXIN) 500 MG tablet Take 500 mg by mouth at bedtime.     . naproxen (NAPROSYN) 500 MG tablet Take 500 mg by mouth 2 (two) times daily with a meal.     No current facility-administered medications on file prior to  visit.     BP 124/78   Pulse 82   Temp 98.3 F (36.8 C) (Oral)   Ht  (1.803 m)   Wt 150 lb 12.8 oz (68.4 kg)   BMI 21.03 kg/m    Objective:   Physical Exam  Constitutional: He is oriented to person, place, and time. He appears well-nourished.  HENT:  Right Ear: Tympanic membrane and ear canal normal.  Left Ear: Tympanic membrane and ear canal normal.  Nose: Nose normal. Right sinus exhibits no maxillary sinus tenderness and no frontal sinus tenderness. Left sinus exhibits no maxillary sinus tenderness and no frontal sinus tenderness.  Mouth/Throat: Oropharynx is clear and moist.  Eyes: Pupils are equal, round, and reactive to light.  Conjunctivae and EOM are normal.  Neck: Neck supple. Carotid bruit is not present. No thyromegaly present.  Mild decrease in ROM from surgery, overall improved  Cardiovascular: Normal rate, regular rhythm and normal heart sounds.   Pulmonary/Chest: Effort normal and breath sounds normal. He has no wheezes. He has no rales.  Abdominal: Soft. Bowel sounds are normal. There is no tenderness.  Musculoskeletal: Normal range of motion.  Neurological: He is alert and oriented to person, place, and time. He has normal reflexes. No cranial nerve deficit.  Skin: Skin is warm and dry.  No obvious tinea versicolor rash noted, however, does have tatoos to entire back.  Psychiatric: He has a normal mood and affect.          Assessment & Plan:

## 2016-12-15 NOTE — Assessment & Plan Note (Signed)
Following with endocrinology 

## 2016-12-24 DIAGNOSIS — M4322 Fusion of spine, cervical region: Secondary | ICD-10-CM | POA: Diagnosis not present

## 2016-12-24 DIAGNOSIS — G9619 Other disorders of meninges, not elsewhere classified: Secondary | ICD-10-CM | POA: Diagnosis not present

## 2016-12-24 DIAGNOSIS — Z981 Arthrodesis status: Secondary | ICD-10-CM | POA: Diagnosis not present

## 2016-12-24 DIAGNOSIS — R490 Dysphonia: Secondary | ICD-10-CM | POA: Diagnosis not present

## 2016-12-30 ENCOUNTER — Encounter: Payer: Self-pay | Admitting: Urology

## 2016-12-30 ENCOUNTER — Ambulatory Visit (INDEPENDENT_AMBULATORY_CARE_PROVIDER_SITE_OTHER): Payer: BLUE CROSS/BLUE SHIELD | Admitting: Urology

## 2016-12-30 VITALS — BP 136/77 | HR 71 | Ht 71.0 in | Wt 152.9 lb

## 2016-12-30 DIAGNOSIS — R310 Gross hematuria: Secondary | ICD-10-CM | POA: Diagnosis not present

## 2016-12-30 DIAGNOSIS — M6289 Other specified disorders of muscle: Secondary | ICD-10-CM | POA: Diagnosis not present

## 2016-12-30 DIAGNOSIS — Z8042 Family history of malignant neoplasm of prostate: Secondary | ICD-10-CM

## 2016-12-30 DIAGNOSIS — N401 Enlarged prostate with lower urinary tract symptoms: Secondary | ICD-10-CM

## 2016-12-30 DIAGNOSIS — R361 Hematospermia: Secondary | ICD-10-CM

## 2016-12-30 DIAGNOSIS — N138 Other obstructive and reflux uropathy: Secondary | ICD-10-CM | POA: Diagnosis not present
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
## 2016-12-31 LAB — URINALYSIS, COMPLETE
BILIRUBIN UA: NEGATIVE
Glucose, UA: NEGATIVE
Ketones, UA: NEGATIVE
Leukocytes, UA: NEGATIVE
NITRITE UA: NEGATIVE
PH UA: 6 (ref 5.0–7.5)
PROTEIN UA: NEGATIVE
RBC, UA: NEGATIVE
Specific Gravity, UA: <1.005 — ABNORMAL LOW (ref 1.005–1.030)
Urobilinogen, Ur: 0.2 mg/dL (ref 0.2–1.0)

## 2016-12-31 LAB — PSA: PROSTATE SPECIFIC AG, SERUM: 0.8 ng/mL (ref 0.0–4.0)

## 2017-01-01 ENCOUNTER — Telehealth: Payer: Self-pay | Admitting: *Deleted

## 2017-01-01 NOTE — Telephone Encounter (Signed)
-----   Message from Harle BattiestShannon A McGowan, PA-C sent at 12/31/2016  8:23 AM EDT ----- Please let Baldo Asharl know that his PSA was normal.

## 2017-01-01 NOTE — Telephone Encounter (Signed)
Spoke with patient and gave results. 

## 2017-01-02 ENCOUNTER — Telehealth: Payer: Self-pay | Admitting: *Deleted

## 2017-01-02 DIAGNOSIS — J3801 Paralysis of vocal cords and larynx, unilateral: Secondary | ICD-10-CM | POA: Diagnosis not present

## 2017-01-02 DIAGNOSIS — M5090 Cervical disc disorder, unspecified, unspecified cervical region: Secondary | ICD-10-CM | POA: Diagnosis not present

## 2017-01-02 DIAGNOSIS — R490 Dysphonia: Secondary | ICD-10-CM | POA: Diagnosis not present

## 2017-01-02 DIAGNOSIS — R6889 Other general symptoms and signs: Secondary | ICD-10-CM | POA: Diagnosis not present

## 2017-01-02 LAB — CULTURE, URINE COMPREHENSIVE

## 2017-01-02 NOTE — Telephone Encounter (Signed)
-----   Message from Harle BattiestShannon A McGowan, PA-C sent at 01/02/2017 12:20 PM EDT ----- Please tell [name] that his urine culture was negative.

## 2017-01-02 NOTE — Telephone Encounter (Signed)
Spoke with patient and gave results. 

## 2017-01-08 DIAGNOSIS — J383 Other diseases of vocal cords: Secondary | ICD-10-CM | POA: Diagnosis not present

## 2017-01-08 DIAGNOSIS — R490 Dysphonia: Secondary | ICD-10-CM | POA: Diagnosis not present

## 2017-01-08 DIAGNOSIS — J3801 Paralysis of vocal cords and larynx, unilateral: Secondary | ICD-10-CM | POA: Diagnosis not present

## 2017-01-20 DIAGNOSIS — J3801 Paralysis of vocal cords and larynx, unilateral: Secondary | ICD-10-CM | POA: Diagnosis not present

## 2017-02-13 DIAGNOSIS — E0590 Thyrotoxicosis, unspecified without thyrotoxic crisis or storm: Secondary | ICD-10-CM | POA: Diagnosis not present

## 2017-02-19 ENCOUNTER — Other Ambulatory Visit: Payer: Self-pay

## 2017-02-19 ENCOUNTER — Ambulatory Visit: Payer: BLUE CROSS/BLUE SHIELD | Attending: Urology | Admitting: Physical Therapy

## 2017-02-19 ENCOUNTER — Encounter: Payer: Self-pay | Admitting: Physical Therapy

## 2017-02-19 VITALS — BP 132/70

## 2017-02-19 DIAGNOSIS — R278 Other lack of coordination: Secondary | ICD-10-CM | POA: Insufficient documentation

## 2017-02-19 DIAGNOSIS — M6208 Separation of muscle (nontraumatic), other site: Secondary | ICD-10-CM | POA: Insufficient documentation

## 2017-02-19 DIAGNOSIS — M5440 Lumbago with sciatica, unspecified side: Secondary | ICD-10-CM | POA: Insufficient documentation

## 2017-02-19 DIAGNOSIS — M542 Cervicalgia: Secondary | ICD-10-CM | POA: Diagnosis not present

## 2017-02-19 DIAGNOSIS — G8929 Other chronic pain: Secondary | ICD-10-CM | POA: Insufficient documentation

## 2017-02-19 NOTE — Patient Instructions (Addendum)
  Avoid straining pelvic floor, abdominal muscles , spine  Use log rolling technique instead of getting out of bed with your neck or the sit-up   Log rolling out of .bed  L  arm overhead  Raise hips and scoot hips to R   Drop knees to L,  scooting L shoulder back to get completely on your L side so your shoulders, hips, and knees point to the L    Then breathe as you drop feet off bed and prop onto L elbow and  use both hands to push yourself      _______  Proper sitting posture  Heels behind feet on floor    ________  Sleep with a pillow between knees    ________  Breathing on the toilet and not pushing with stomach Use a foot stool to proper feet.
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
## 2017-02-26 ENCOUNTER — Ambulatory Visit: Payer: BLUE CROSS/BLUE SHIELD | Admitting: Physical Therapy

## 2017-03-05 DIAGNOSIS — R490 Dysphonia: Secondary | ICD-10-CM | POA: Diagnosis not present

## 2017-03-05 DIAGNOSIS — J3801 Paralysis of vocal cords and larynx, unilateral: Secondary | ICD-10-CM | POA: Diagnosis not present

## 2017-03-10 HISTORY — PX: SHOULDER SURGERY: SHX246

## 2017-03-13 ENCOUNTER — Ambulatory Visit: Payer: BLUE CROSS/BLUE SHIELD | Admitting: Physical Therapy

## 2017-03-19 ENCOUNTER — Ambulatory Visit: Payer: BLUE CROSS/BLUE SHIELD | Attending: Urology | Admitting: Physical Therapy

## 2017-03-26 DIAGNOSIS — Z981 Arthrodesis status: Secondary | ICD-10-CM | POA: Diagnosis not present
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
## 2017-03-31 ENCOUNTER — Encounter: Payer: Self-pay | Admitting: Urology

## 2017-03-31 ENCOUNTER — Ambulatory Visit (INDEPENDENT_AMBULATORY_CARE_PROVIDER_SITE_OTHER): Payer: BLUE CROSS/BLUE SHIELD | Admitting: Urology

## 2017-03-31 VITALS — BP 144/75 | HR 78 | Ht 71.0 in | Wt 164.0 lb

## 2017-03-31 DIAGNOSIS — N138 Other obstructive and reflux uropathy: Secondary | ICD-10-CM | POA: Diagnosis not present

## 2017-03-31 DIAGNOSIS — N401 Enlarged prostate with lower urinary tract symptoms: Secondary | ICD-10-CM | POA: Diagnosis not present

## 2017-03-31 DIAGNOSIS — Z8042 Family history of malignant neoplasm of prostate: Secondary | ICD-10-CM

## 2017-03-31 DIAGNOSIS — R361 Hematospermia: Secondary | ICD-10-CM

## 2017-03-31 LAB — BLADDER SCAN AMB NON-IMAGING: SCAN RESULT: 24

## 2017-03-31 MED ORDER — FINASTERIDE 5 MG PO TABS: 5.0000 mg | ORAL_TABLET | Freq: Every day | ORAL | 3 refills | Status: DC

## 2017-03-31 MED ORDER — TAMSULOSIN HCL 0.4 MG PO CAPS: 0.4000 mg | ORAL_CAPSULE | Freq: Every day | ORAL | 3 refills | Status: DC

## 2017-04-01 ENCOUNTER — Ambulatory Visit: Payer: BLUE CROSS/BLUE SHIELD | Admitting: Urology

## 2017-04-02 ENCOUNTER — Ambulatory Visit: Payer: BLUE CROSS/BLUE SHIELD | Admitting: Physical Therapy

## 2017-04-16 ENCOUNTER — Ambulatory Visit: Payer: BLUE CROSS/BLUE SHIELD | Attending: Urology | Admitting: Physical Therapy

## 2017-04-28 ENCOUNTER — Encounter: Payer: Self-pay | Admitting: Primary Care

## 2017-04-28 ENCOUNTER — Ambulatory Visit (INDEPENDENT_AMBULATORY_CARE_PROVIDER_SITE_OTHER): Payer: BLUE CROSS/BLUE SHIELD | Admitting: Primary Care

## 2017-04-28 VITALS — BP 130/76 | HR 67 | Temp 98.2°F | Ht 71.0 in | Wt 160.2 lb

## 2017-04-28 DIAGNOSIS — Z113 Encounter for screening for infections with a predominantly sexual mode of transmission: Secondary | ICD-10-CM

## 2017-04-28 NOTE — Patient Instructions (Signed)
Stop by the lab prior to leaving today. I will notify you of your results once received.   It was a pleasure to see you today!  
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
## 2017-04-29 LAB — C. TRACHOMATIS/N. GONORRHOEAE RNA
C. TRACHOMATIS RNA, TMA: NOT DETECTED
N. GONORRHOEAE RNA, TMA: NOT DETECTED

## 2017-04-30 ENCOUNTER — Encounter: Payer: BLUE CROSS/BLUE SHIELD | Admitting: Physical Therapy

## 2017-04-30 ENCOUNTER — Ambulatory Visit (INDEPENDENT_AMBULATORY_CARE_PROVIDER_SITE_OTHER): Payer: Self-pay | Admitting: Urology

## 2017-04-30 ENCOUNTER — Encounter: Payer: Self-pay | Admitting: Urology

## 2017-04-30 VITALS — BP 135/71 | HR 83 | Ht 71.0 in | Wt 160.0 lb

## 2017-04-30 DIAGNOSIS — N400 Enlarged prostate without lower urinary tract symptoms: Secondary | ICD-10-CM

## 2017-04-30 DIAGNOSIS — Z8042 Family history of malignant neoplasm of prostate: Secondary | ICD-10-CM

## 2017-04-30 DIAGNOSIS — N138 Other obstructive and reflux uropathy: Secondary | ICD-10-CM

## 2017-04-30 DIAGNOSIS — R361 Hematospermia: Secondary | ICD-10-CM

## 2017-04-30 DIAGNOSIS — N401 Enlarged prostate with lower urinary tract symptoms: Secondary | ICD-10-CM

## 2017-04-30 LAB — BLADDER SCAN AMB NON-IMAGING

## 2017-04-30 MED ORDER — FINASTERIDE 5 MG PO TABS: 5.0000 mg | ORAL_TABLET | Freq: Every day | ORAL | 3 refills | Status: DC
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
## 2017-05-01 LAB — HIV ANTIBODY (ROUTINE TESTING W REFLEX): HIV 1&2 Ab, 4th Generation: NONREACTIVE

## 2017-05-01 LAB — HSV 1/2 AB (IGM), IFA W/RFLX TITER
HSV 1 IGM SCREEN: NEGATIVE
HSV 2 IGM SCREEN: NEGATIVE

## 2017-05-01 LAB — HSV(HERPES SIMPLEX VRS) I + II AB-IGG
HAV 1 IGG,TYPE SPECIFIC AB: 17.3 index — ABNORMAL HIGH
HSV 2 IGG,TYPE SPECIFIC AB: <0.9 index

## 2017-05-01 LAB — RPR: RPR Ser Ql: NONREACTIVE

## 2017-05-28 DIAGNOSIS — X58XXXD Exposure to other specified factors, subsequent encounter: Secondary | ICD-10-CM | POA: Diagnosis not present

## 2017-05-28 DIAGNOSIS — M79602 Pain in left arm: Secondary | ICD-10-CM | POA: Diagnosis not present

## 2017-05-28 DIAGNOSIS — M25512 Pain in left shoulder: Secondary | ICD-10-CM | POA: Diagnosis not present

## 2017-05-28 DIAGNOSIS — Z981 Arthrodesis status: Secondary | ICD-10-CM | POA: Diagnosis not present

## 2017-05-28 DIAGNOSIS — Z87891 Personal history of nicotine dependence: Secondary | ICD-10-CM | POA: Diagnosis not present

## 2017-05-28 DIAGNOSIS — S46002A Unspecified injury of muscle(s) and tendon(s) of the rotator cuff of left shoulder, initial encounter: Secondary | ICD-10-CM | POA: Diagnosis not present

## 2017-05-28 DIAGNOSIS — S46002D Unspecified injury of muscle(s) and tendon(s) of the rotator cuff of left shoulder, subsequent encounter: Secondary | ICD-10-CM | POA: Diagnosis not present

## 2017-06-15 DIAGNOSIS — M75112 Incomplete rotator cuff tear or rupture of left shoulder, not specified as traumatic: Secondary | ICD-10-CM | POA: Diagnosis not present

## 2017-06-15 DIAGNOSIS — M779 Enthesopathy, unspecified: Secondary | ICD-10-CM | POA: Diagnosis not present

## 2017-06-15 DIAGNOSIS — Z87891 Personal history of nicotine dependence: Secondary | ICD-10-CM | POA: Diagnosis not present

## 2017-06-15 DIAGNOSIS — S40022A Contusion of left upper arm, initial encounter: Secondary | ICD-10-CM | POA: Diagnosis not present

## 2017-06-15 DIAGNOSIS — S46002A Unspecified injury of muscle(s) and tendon(s) of the rotator cuff of left shoulder, initial encounter: Secondary | ICD-10-CM | POA: Diagnosis not present

## 2017-06-15 DIAGNOSIS — X58XXXA Exposure to other specified factors, initial encounter: Secondary | ICD-10-CM | POA: Diagnosis not present

## 2017-06-15 DIAGNOSIS — M7552 Bursitis of left shoulder: Secondary | ICD-10-CM | POA: Diagnosis not present

## 2017-06-15 DIAGNOSIS — M5412 Radiculopathy, cervical region: Secondary | ICD-10-CM | POA: Diagnosis not present

## 2017-06-18 DIAGNOSIS — M7592 Shoulder lesion, unspecified, left shoulder: Secondary | ICD-10-CM | POA: Diagnosis not present

## 2017-06-18 DIAGNOSIS — M7552 Bursitis of left shoulder: Secondary | ICD-10-CM | POA: Diagnosis not present

## 2017-06-18 DIAGNOSIS — R202 Paresthesia of skin: Secondary | ICD-10-CM | POA: Diagnosis not present

## 2017-06-18 DIAGNOSIS — M25512 Pain in left shoulder: Secondary | ICD-10-CM | POA: Diagnosis not present

## 2017-06-18 DIAGNOSIS — G8929 Other chronic pain: Secondary | ICD-10-CM | POA: Diagnosis not present

## 2017-06-23 DIAGNOSIS — E0590 Thyrotoxicosis, unspecified without thyrotoxic crisis or storm: Secondary | ICD-10-CM | POA: Diagnosis not present

## 2017-07-01 ENCOUNTER — Ambulatory Visit: Payer: Self-pay

## 2017-07-01 ENCOUNTER — Ambulatory Visit: Payer: BLUE CROSS/BLUE SHIELD | Admitting: Family

## 2017-07-01 ENCOUNTER — Encounter: Payer: Self-pay | Admitting: Family

## 2017-07-01 VITALS — BP 140/86 | HR 90 | Temp 98.3°F | Ht 71.0 in | Wt 163.1 lb

## 2017-07-01 DIAGNOSIS — J019 Acute sinusitis, unspecified: Secondary | ICD-10-CM

## 2017-07-01 DIAGNOSIS — J309 Allergic rhinitis, unspecified: Secondary | ICD-10-CM | POA: Diagnosis not present

## 2017-07-01 MED ORDER — AZELASTINE HCL 0.05 % OP SOLN: 1.0000 [drp] | Freq: Two times a day (BID) | OPHTHALMIC | 12 refills | Status: DC

## 2017-07-01 MED ORDER — FLUTICASONE PROPIONATE 50 MCG/ACT NA SUSP: 2.0000 | Freq: Every day | NASAL | 6 refills | Status: DC

## 2017-07-01 MED ORDER — LEVOCETIRIZINE DIHYDROCHLORIDE 5 MG PO TABS: 5.0000 mg | ORAL_TABLET | Freq: Every evening | ORAL | 3 refills | Status: DC

## 2017-07-01 MED ORDER — AMOXICILLIN-POT CLAVULANATE 875-125 MG PO TABS: 1.0000 | ORAL_TABLET | Freq: Two times a day (BID) | ORAL | 0 refills | Status: DC
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
## 2017-07-01 NOTE — Telephone Encounter (Signed)
Pt. Reports started having sinus pain and pressure 2 days ago. States he has pain in his face, jaw, teeth and ears. Denies fever. Clear discharge from nose. Has tried OTC with no relief. Appointment made for today.  Reason for Disposition . [1] Sinus pain (not just congestion) AND [2] fever  Answer Assessment - Initial Assessment Questions 1. LOCATION: "Where does it hurt?"      Face,jaw and teeth 2. ONSET: "When did the sinus pain start?"  (e.g., hours, days)      Started 2 days ago 3. SEVERITY: "How bad is the pain?"   (Scale 1-10; mild, moderate or severe)   - MILD (1-3): doesn't interfere with normal activities    - MODERATE (4-7): interferes with normal activities (e.g., work or school) or awakens from sleep   - SEVERE (8-10): excruciating pain and patient unable to do any normal activities        7-8 4. RECURRENT SYMPTOM: "Have you ever had sinus problems before?" If so, ask: "When was the last time?" and "What happened that time?"      Yes 5. NASAL CONGESTION: "Is the nose blocked?" If so, ask, "Can you open it or must you breathe through the mouth?"     Nose is blocked 6. NASAL DISCHARGE: "Do you have discharge from your nose?" If so ask, "What color?"     Clear 7. FEVER: "Do you have a fever?" If so, ask: "What is it, how was it measured, and when did it start?"      No 8. OTHER SYMPTOMS: "Do you have any other symptoms?" (e.g., sore throat, cough, earache, difficulty breathing)     Ear pain, small cough 9. PREGNANCY: "Is there any chance you are pregnant?" "When was your last menstrual period?"     No  Protocols used: SINUS PAIN OR CONGESTION-A-AH

## 2017-07-01 NOTE — Telephone Encounter (Signed)
FYI
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
## 2017-08-06 ENCOUNTER — Encounter: Payer: Self-pay | Admitting: Urology

## 2017-08-06 ENCOUNTER — Ambulatory Visit (INDEPENDENT_AMBULATORY_CARE_PROVIDER_SITE_OTHER): Payer: BLUE CROSS/BLUE SHIELD | Admitting: Urology

## 2017-08-06 VITALS — BP 131/85 | HR 88 | Ht 71.0 in | Wt 161.2 lb

## 2017-08-06 DIAGNOSIS — N138 Other obstructive and reflux uropathy: Secondary | ICD-10-CM

## 2017-08-06 DIAGNOSIS — M25512 Pain in left shoulder: Secondary | ICD-10-CM | POA: Diagnosis not present

## 2017-08-06 DIAGNOSIS — G540 Brachial plexus disorders: Secondary | ICD-10-CM | POA: Diagnosis not present

## 2017-08-06 DIAGNOSIS — Z8042 Family history of malignant neoplasm of prostate: Secondary | ICD-10-CM

## 2017-08-06 DIAGNOSIS — N401 Enlarged prostate with lower urinary tract symptoms: Secondary | ICD-10-CM

## 2017-08-06 DIAGNOSIS — R361 Hematospermia: Secondary | ICD-10-CM

## 2017-08-06 DIAGNOSIS — M79602 Pain in left arm: Secondary | ICD-10-CM | POA: Diagnosis not present

## 2017-08-06 LAB — URINALYSIS, COMPLETE: SCAN RESULT: 0

## 2017-08-06 LAB — BLADDER SCAN AMB NON-IMAGING

## 2017-08-06 MED ORDER — TAMSULOSIN HCL 0.4 MG PO CAPS: 0.4000 mg | ORAL_CAPSULE | Freq: Every day | ORAL | 3 refills | Status: DC

## 2017-08-06 MED ORDER — FINASTERIDE 5 MG PO TABS: 5.0000 mg | ORAL_TABLET | Freq: Every day | ORAL | 3 refills | Status: DC

## 2017-08-10 DIAGNOSIS — M25312 Other instability, left shoulder: Secondary | ICD-10-CM | POA: Diagnosis not present

## 2017-08-21 DIAGNOSIS — M25312 Other instability, left shoulder: Secondary | ICD-10-CM | POA: Diagnosis not present

## 2017-08-21 DIAGNOSIS — M75122 Complete rotator cuff tear or rupture of left shoulder, not specified as traumatic: Secondary | ICD-10-CM | POA: Diagnosis not present

## 2017-08-24 DIAGNOSIS — M50122 Cervical disc disorder at C5-C6 level with radiculopathy: Secondary | ICD-10-CM | POA: Diagnosis not present

## 2017-08-24 DIAGNOSIS — M79602 Pain in left arm: Secondary | ICD-10-CM | POA: Diagnosis not present

## 2017-08-27 ENCOUNTER — Other Ambulatory Visit: Payer: Self-pay

## 2017-08-27 DIAGNOSIS — S46012A Strain of muscle(s) and tendon(s) of the rotator cuff of left shoulder, initial encounter: Secondary | ICD-10-CM | POA: Diagnosis not present

## 2017-08-27 DIAGNOSIS — B360 Pityriasis versicolor: Secondary | ICD-10-CM

## 2017-08-27 NOTE — Telephone Encounter (Signed)
Needs office visit.

## 2017-08-27 NOTE — Telephone Encounter (Signed)
fluconalzole last refilled # 4 on 12/15/16. Last annual exam on 12/15/16 and last seen 04/28/17.Please advise.

## 2017-08-27 NOTE — Telephone Encounter (Signed)
Copied from CRM [id number]. Topic: General - Other >> Aug 27, 2017  1:30 PM Tamela OddiHarris, Brenda J wrote: Reason for CRM: Patient called to request the doctor to send Fluconazole to his pharmacy for his skin breakout.  He stated that the doctor gave him a few pills  before when he had a breakout last year.  CB#[phone].

## 2017-08-31 NOTE — Telephone Encounter (Signed)
Message left for patient to return my call.  

## 2017-09-01 NOTE — Telephone Encounter (Signed)
Spoken to patient and appt on 09/02/2017

## 2017-09-02 ENCOUNTER — Ambulatory Visit: Payer: BLUE CROSS/BLUE SHIELD | Admitting: Primary Care

## 2017-09-02 ENCOUNTER — Encounter: Payer: Self-pay | Admitting: Primary Care

## 2017-09-02 VITALS — BP 126/78 | HR 74 | Temp 98.5°F | Ht 71.0 in | Wt 160.2 lb

## 2017-09-02 DIAGNOSIS — B360 Pityriasis versicolor: Secondary | ICD-10-CM | POA: Diagnosis not present

## 2017-09-02 MED ORDER — ITRACONAZOLE 100 MG PO CAPS: 200.0000 mg | ORAL_CAPSULE | Freq: Every day | ORAL | 0 refills | Status: DC

## 2017-09-02 NOTE — Assessment & Plan Note (Signed)
Seems to be typical tinea versicolor flare. Given the fluconazole 300 mg weekly x 2 weeks wasn't effective for resolve, will try itraconazole 200 mg daily x 5 days.  If no resolve then send to dermatology for further evaluation.
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
## 2017-09-02 NOTE — Patient Instructions (Signed)
Start itraconazole 100 mg capsules. Take 2 capsules once daily for 5 days.   Please update us if no improvement or resolve.  It was a pleasure to see you today!

## 2017-09-07 ENCOUNTER — Telehealth: Payer: Self-pay | Admitting: Primary Care

## 2017-09-07 DIAGNOSIS — B360 Pityriasis versicolor: Secondary | ICD-10-CM

## 2017-09-07 NOTE — Telephone Encounter (Signed)
Allayne GitelmanK Clark NP out of office with no computer access until 09/16/17.Please advise.

## 2017-09-07 NOTE — Telephone Encounter (Signed)
LOV  09/02/17  Mayra ReelKate Clark See request

## 2017-09-07 NOTE — Telephone Encounter (Signed)
Copied from CRM [phone]. Topic: Quick Communication - Rx Refill/Question >> Sep 07, 2017 10:06 AM Alexander BergeronBarksdale, Harvey B wrote: Medication: itraconazole (SPORANOX) 100 MG capsule [[id number]][[id number]] ,   Pt called b/c the pharmacist spoke w/ the pt and states the medication above will interact w/ one of the pt's prostate meds ( pt did not know which one), pt needs alternate med; contact to advise

## 2017-09-08 MED ORDER — ITRACONAZOLE 100 MG PO CAPS: 200.0000 mg | ORAL_CAPSULE | Freq: Every day | ORAL | Status: AC

## 2017-09-08 NOTE — Telephone Encounter (Signed)
Was told drug interaction with tamsulosin.   Plasma concentrations and pharmacologic or toxic effects of tamsulosin may be increased by itraconazole. Potential for hypotension and bradycardia.

## 2017-09-08 NOTE — Telephone Encounter (Signed)
Pharmacy and patient advised of these instructions.

## 2017-09-08 NOTE — Telephone Encounter (Signed)
I assume this was about the itraconazole. Please verify.  Please get extra info from pharmacy about the interaction, other med, possible outcome, etc.  Let me know.  Thanks.

## 2017-09-08 NOTE — Telephone Encounter (Signed)
This should be reasonable: Skip the flomax for 2 days, then start itraconazole.   Stay off flomax while on itraconazole.  Restart flomax 2 days after finishing itraconazole.  Thanks.
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
## 2017-09-14 ENCOUNTER — Telehealth: Payer: Self-pay | Admitting: Urology

## 2017-09-14 NOTE — Telephone Encounter (Signed)
Patient never got his RUS that you ordered back in 2018. He has since been seen by Carollee HerterShannon and is being set up for UDS. Does he still need to RUS?   Marcelino DusterMichelle

## 2017-09-16 ENCOUNTER — Other Ambulatory Visit: Payer: Self-pay | Admitting: Urology

## 2017-09-16 DIAGNOSIS — R350 Frequency of micturition: Secondary | ICD-10-CM | POA: Diagnosis not present

## 2017-09-16 DIAGNOSIS — R3911 Hesitancy of micturition: Secondary | ICD-10-CM | POA: Diagnosis not present

## 2017-09-16 DIAGNOSIS — R351 Nocturia: Secondary | ICD-10-CM | POA: Diagnosis not present

## 2017-09-21 ENCOUNTER — Ambulatory Visit: Payer: BLUE CROSS/BLUE SHIELD | Admitting: Urology

## 2017-09-22 ENCOUNTER — Telehealth: Payer: Self-pay | Admitting: Urology

## 2017-09-22 NOTE — Telephone Encounter (Signed)
Patient was unable to get off work for his results app and said he will call back to reschedule it.   Eric Shannon

## 2017-09-23 DIAGNOSIS — S46012D Strain of muscle(s) and tendon(s) of the rotator cuff of left shoulder, subsequent encounter: Secondary | ICD-10-CM | POA: Diagnosis not present

## 2017-09-23 DIAGNOSIS — Z981 Arthrodesis status: Secondary | ICD-10-CM | POA: Diagnosis not present

## 2017-09-23 DIAGNOSIS — S43002D Unspecified subluxation of left shoulder joint, subsequent encounter: Secondary | ICD-10-CM | POA: Diagnosis not present

## 2017-09-23 DIAGNOSIS — M79602 Pain in left arm: Secondary | ICD-10-CM | POA: Diagnosis not present

## 2017-09-23 DIAGNOSIS — G9619 Other disorders of meninges, not elsewhere classified: Secondary | ICD-10-CM | POA: Diagnosis not present

## 2017-09-23 DIAGNOSIS — R202 Paresthesia of skin: Secondary | ICD-10-CM | POA: Diagnosis not present

## 2017-09-23 DIAGNOSIS — X58XXXD Exposure to other specified factors, subsequent encounter: Secondary | ICD-10-CM | POA: Diagnosis not present

## 2017-09-23 DIAGNOSIS — R200 Anesthesia of skin: Secondary | ICD-10-CM | POA: Diagnosis not present

## 2017-09-23 DIAGNOSIS — M25512 Pain in left shoulder: Secondary | ICD-10-CM | POA: Diagnosis not present

## 2017-10-06 DIAGNOSIS — S46012D Strain of muscle(s) and tendon(s) of the rotator cuff of left shoulder, subsequent encounter: Secondary | ICD-10-CM | POA: Diagnosis not present

## 2017-10-08 ENCOUNTER — Ambulatory Visit (INDEPENDENT_AMBULATORY_CARE_PROVIDER_SITE_OTHER): Payer: BLUE CROSS/BLUE SHIELD | Admitting: Internal Medicine

## 2017-10-08 ENCOUNTER — Ambulatory Visit: Payer: BLUE CROSS/BLUE SHIELD

## 2017-10-08 ENCOUNTER — Encounter: Payer: Self-pay | Admitting: Internal Medicine

## 2017-10-08 VITALS — BP 124/84 | HR 76 | Temp 98.4°F | Wt 158.0 lb

## 2017-10-08 DIAGNOSIS — Z113 Encounter for screening for infections with a predominantly sexual mode of transmission: Secondary | ICD-10-CM

## 2017-10-08 NOTE — Addendum Note (Signed)
Addended by: Wendi MayaGREESON, Meshelle Holness on: 10/08/2017 05:08 PM   Modules accepted: Orders

## 2017-10-08 NOTE — Progress Notes (Signed)
Subjective:    Patient ID: Eric Shannon, male    DOB: [birth year], 37 y.o.   MRN: [id number]  HPI  Pt presents to the clinic today for STD screening. He denies any current penile discharge, dysuria or testicular pain. He reports he is currently sexually active with 1 person. They do not wear condoms. He reports she constantly gets BV and UTI's especially after sexual intercourse. He wants to make sure that he does not have any STD's.  Review of Systems      Past Medical History:  Diagnosis Date  . Asthma   . Chickenpox   . Elevated blood pressure   . Migraine     Current Outpatient Medications  Medication Sig Dispense Refill  . azelastine (OPTIVAR) 0.05 % ophthalmic solution Place 1 drop into both eyes 2 (two) times daily. 6 mL 12  . cetirizine (ZYRTEC) 10 MG tablet Take 1 tablet by mouth every night at bedtime for post nasal drip. 90 tablet 0  . finasteride (PROSCAR) 5 MG tablet Take 1 tablet (5 mg total) by mouth daily. 90 tablet 3  . fluticasone (FLONASE) 50 MCG/ACT nasal spray Place 2 sprays into both nostrils daily. 16 g 6  . meloxicam (MOBIC) 15 MG tablet Take 1 tablet (15 mg total) by mouth daily. 30 tablet 1  . methimazole (TAPAZOLE) 5 MG tablet Take 5 mg by mouth 3 (three) times daily.    . Multiple Vitamin (MULTI-VITAMINS) TABS Take by mouth.    . tamsulosin (FLOMAX) 0.4 MG CAPS capsule Take 1 capsule (0.4 mg total) by mouth daily. 90 capsule 3   No current facility-administered medications for this visit.     No Known Allergies  Family History  Problem Relation Age of Onset  . Hypertension Father   . Hypertension Mother   . Hyperthyroidism Mother   . Prostate cancer Maternal Grandfather   . Prostate cancer Maternal Uncle   . Prostate cancer Paternal Uncle   . Kidney cancer Neg Hx   . Bladder Cancer Neg Hx     Social History   Socioeconomic History  . Marital status: Single    Spouse name: Not on file  . Number of children: Not on file  . Years of  education: Not on file  . Highest education level: Not on file  Occupational History  . Not on file  Social Needs  . Financial resource strain: Not on file  . Food insecurity:    Worry: Not on file    Inability: Not on file  . Transportation needs:    Medical: Not on file    Non-medical: Not on file  Tobacco Use  . Smoking status: Former Smoker    Last attempt to quit: 04/05/1999    Years since quitting: 18.5  . Smokeless tobacco: Never Used  Substance and Sexual Activity  . Alcohol use: Yes    Alcohol/week: 0.0 oz    Comment: social  . Drug use: No  . Sexual activity: Yes  Lifestyle  . Physical activity:    Days per week: Not on file    Minutes per session: Not on file  . Stress: Not at all  Relationships  . Social connections:    Talks on phone: Not on file    Gets together: Not on file    Attends religious service: Not on file    Active member of club or organization: Not on file    Attends meetings of clubs or organizations: Not on file  Relationship status: Not on file  . Intimate partner violence:    Fear of current or ex partner: Not on file    Emotionally abused: Not on file    Physically abused: Not on file    Forced sexual activity: Not on file  Other Topics Concern  . Not on file  Social History Narrative   Single.   1 daughter.   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   Enjoys spending time with his daughter.     Constitutional: Denies fever, malaise, fatigue, headache or abrupt weight changes.  Gastrointestinal: Denies abdominal pain, bloating, constipation, diarrhea or blood in the stool.  GU: Denies urgency, frequency, pain with urination, burning sensation, blood in urine, odor or discharge. Skin: Denies redness, rashes, lesions or ulcercations.   No other specific complaints in a complete review of systems (except as listed in HPI above).  Objective:   Physical Exam  BP 124/84   Pulse 76   Temp 98.4 F (36.9 C) (Oral)   Wt 158 lb (71.7  kg)   SpO2 98%   BMI 22.04 kg/m  Wt Readings from Last 3 Encounters:  10/08/17 158 lb (71.7 kg)  09/02/17 160 lb 4 oz (72.7 kg)  08/06/17 161 lb 3.2 oz (73.1 kg)    General: Appears his stated age, well developed, well nourished in NAD. Abdomen: Soft and nontender. Normal bowel sounds. No distention or masses noted. GU: Deferred. Neurological: Alert and oriented.   BMET    Component Value Date/Time   NA 139 12/15/2016 0837   K 4.6 12/15/2016 0837   CL 103 12/15/2016 0837   CO2 30 12/15/2016 0837   GLUCOSE 107 (H) 12/15/2016 0837   BUN 12 12/15/2016 0837   CREATININE 1.07 12/15/2016 0837   CALCIUM 9.3 12/15/2016 0837    Lipid Panel     Component Value Date/Time   CHOL 157 12/15/2016 0837   TRIG 54.0 12/15/2016 0837   HDL 78.20 12/15/2016 0837   CHOLHDL 2 12/15/2016 0837   VLDL 10.8 12/15/2016 0837   LDLCALC 68 12/15/2016 0837    CBC    Component Value Date/Time   WBC 11.6 (H) 05/14/2015 0935   RBC 5.25 05/14/2015 0935   HGB 15.9 05/14/2015 0935   HCT 46.8 05/14/2015 0935   PLT 209.0 05/14/2015 0935   MCV 89.0 05/14/2015 0935   MCHC 34.1 05/14/2015 0935   RDW 13.7 05/14/2015 0935    Hgb A1C No results found for: HGBA1C          Assessment & Plan:   Screen for STD:  Will check urine gonorrhea, chlamydia and trich Discussed that BV and UTI's are not sexually transmitted Discussed safe sex, use of condoms and how they can prevent STD's He declines HIV and RPR today  Will follow up after labs are back, RTC as needed Nicki Reaperegina Baity, NP

## 2017-10-08 NOTE — Patient Instructions (Signed)
Sexually Transmitted Disease  A sexually transmitted disease (STD) is a disease or infection that may be passed (transmitted) from person to person, usually during sexual activity. This may happen by way of saliva, semen, blood, vaginal mucus, or urine. Common STDs include:   Gonorrhea.   Chlamydia.   Syphilis.   HIV and AIDS.   Genital herpes.   Hepatitis B and C.   Trichomonas.   Human papillomavirus (HPV).   Pubic lice.   Scabies.   Mites.   Bacterial vaginosis.    What are the causes?  An STD may be caused by bacteria, a virus, or parasites. STDs are often transmitted during sexual activity if one person is infected. However, they may also be transmitted through nonsexual means. STDs may be transmitted after:   Sexual intercourse with an infected person.   Sharing sex toys with an infected person.   Sharing needles with an infected person or using unclean piercing or tattoo needles.   Having intimate contact with the genitals, mouth, or rectal areas of an infected person.   Exposure to infected fluids during birth.    What are the signs or symptoms?  Different STDs have different symptoms. Some people may not have any symptoms. If symptoms are present, they may include:   Painful or bloody urination.   Pain in the pelvis, abdomen, vagina, anus, throat, or eyes.   A skin rash, itching, or irritation.   Growths, ulcerations, blisters, or sores in the genital and anal areas.   Abnormal vaginal discharge with or without bad odor.   Penile discharge in men.   Fever.   Pain or bleeding during sexual intercourse.   Swollen glands in the groin area.   Yellow skin and eyes (jaundice). This is seen with hepatitis.   Swollen testicles.   Infertility.   Sores and blisters in the mouth.    How is this diagnosed?  To make a diagnosis, your health care provider may:   Take a medical history.   Perform a physical exam.   Take a sample of any discharge to examine.   Swab the throat, cervix,  opening to the penis, rectum, or vagina for testing.   Test a sample of your first morning urine.   Perform blood tests.   Perform a Pap test, if this applies.   Perform a colposcopy.   Perform a laparoscopy.    How is this treated?  Treatment depends on the STD. Some STDs may be treated but not cured.   Chlamydia, gonorrhea, trichomonas, and syphilis can be cured with antibiotic medicine.   Genital herpes, hepatitis, and HIV can be treated, but not cured, with prescribed medicines. The medicines lessen symptoms.   Genital warts from HPV can be treated with medicine or by freezing, burning (electrocautery), or surgery. Warts may come back.   HPV cannot be cured with medicine or surgery. However, abnormal areas may be removed from the cervix, vagina, or vulva.   If your diagnosis is confirmed, your recent sexual partners need treatment. This is true even if they are symptom-free or have a negative culture or evaluation. They should not have sex until their health care providers say it is okay.   Your health care provider may test you for infection again 3 months after treatment.    How is this prevented?  Take these steps to reduce your risk of getting an STD:   Use latex condoms, dental dams, and water-soluble lubricants during sexual activity. Do not use   petroleum jelly or oils.   Avoid having multiple sex partners.   Do not have sex with someone who has other sex partners.   Do not have sex with anyone you do not know or who is at high risk for an STD.   Avoid risky sex practices that can break your skin.   Do not have sex if you have open sores on your mouth or skin.   Avoid drinking too much alcohol or taking illegal drugs. Alcohol and drugs can affect your judgment and put you in a vulnerable position.   Avoid engaging in oral and anal sex acts.   Get vaccinated for HPV and hepatitis. If you have not received these vaccines in the past, talk to your health care provider about whether one or  both might be right for you.   If you are at risk of being infected with HIV, it is recommended that you take a prescription medicine daily to prevent HIV infection. This is called pre-exposure prophylaxis (PrEP). You are considered at risk if:  ? You are a man who has sex with other men (MSM).  ? You are a heterosexual man or woman and are sexually active with more than one partner.  ? You take drugs by injection.  ? You are sexually active with a partner who has HIV.   Talk with your health care provider about whether you are at high risk of being infected with HIV. If you choose to begin PrEP, you should first be tested for HIV. You should then be tested every 3 months for as long as you are taking PrEP.    Contact a health care provider if:   See your health care provider.   Tell your sexual partner(s). They should be tested and treated for any STDs.   Do not have sex until your health care provider says it is okay.  Get help right away if:  Contact your health care provider right away if:   You have severe abdominal pain.   You are a man and notice swelling or pain in your testicles.   You are a woman and notice swelling or pain in your vagina.    This information is not intended to replace advice given to you by your health care provider. Make sure you discuss any questions you have with your health care provider.  Document Released: 05/17/2002 Document Revised: 09/14/2015 Document Reviewed: 09/14/2012  [employer] Interactive Patient Education  2018 [employer]

## 2017-10-09 LAB — C. TRACHOMATIS/N. GONORRHOEAE RNA
C. trachomatis RNA, TMA: NOT DETECTED
N. GONORRHOEAE RNA, TMA: NOT DETECTED

## 2017-10-09 LAB — TRICHOMONAS VAGINALIS RNA, QL,MALES: Trichomonas vaginalis RNA: NOT DETECTED
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
## 2017-10-13 ENCOUNTER — Encounter: Payer: Self-pay | Admitting: Urology

## 2017-10-13 ENCOUNTER — Ambulatory Visit (INDEPENDENT_AMBULATORY_CARE_PROVIDER_SITE_OTHER): Payer: BLUE CROSS/BLUE SHIELD | Admitting: Urology

## 2017-10-13 VITALS — BP 137/89 | HR 80 | Wt 157.5 lb

## 2017-10-13 DIAGNOSIS — Z8042 Family history of malignant neoplasm of prostate: Secondary | ICD-10-CM

## 2017-10-13 DIAGNOSIS — N138 Other obstructive and reflux uropathy: Secondary | ICD-10-CM | POA: Diagnosis not present

## 2017-10-13 DIAGNOSIS — N401 Enlarged prostate with lower urinary tract symptoms: Secondary | ICD-10-CM

## 2017-10-13 MED ORDER — FINASTERIDE 5 MG PO TABS: 5.0000 mg | ORAL_TABLET | Freq: Every day | ORAL | 3 refills | Status: DC

## 2017-10-13 MED ORDER — TAMSULOSIN HCL 0.4 MG PO CAPS: 0.4000 mg | ORAL_CAPSULE | Freq: Every day | ORAL | 3 refills | Status: DC

## 2017-10-16 ENCOUNTER — Encounter: Payer: Self-pay | Admitting: *Deleted

## 2017-10-16 ENCOUNTER — Encounter: Payer: Self-pay | Admitting: Family Medicine

## 2017-10-16 ENCOUNTER — Ambulatory Visit: Payer: BLUE CROSS/BLUE SHIELD | Admitting: Family Medicine

## 2017-10-16 VITALS — BP 100/74 | HR 69 | Temp 98.6°F | Ht 71.0 in | Wt 161.8 lb

## 2017-10-16 DIAGNOSIS — R369 Urethral discharge, unspecified: Secondary | ICD-10-CM | POA: Diagnosis not present

## 2017-10-16 DIAGNOSIS — R360 Urethral discharge without blood: Secondary | ICD-10-CM

## 2017-10-16 NOTE — Patient Instructions (Addendum)
Please stop at the lab to have labs drawn.  We will call with results.

## 2017-10-16 NOTE — Progress Notes (Signed)
Subjective:    Patient ID: Eric Shannon, male    DOB: Sep 13, 1980, 37 y.o.   MRN: [id number]  HPI  37 year old male pt presents with  penile discharge  This AM at tip of penis prior to urination.    No current dysuria, no pain, no ithcing, no redness.  No blood.  No fever, NO Abd pain.  No ulcer but issue is recurrent as below.   Has been seen by PCP and uro for this. Neg STD eval including C. Rupert Stacks Gonorrhea, trich on 10/08/2017    Seen on 10/13/2017 by urology for BPH symptoms and peile discharge:  Summary as follows: bother from occasional intervoid scant volume milky / sticky discharge (drop at most). He is very concerned about possible infection. No dysuria.[name] GC/HIV/RPR negative x several. PSA also normal. UA normal x several. No hematuria, no mucosuria. DRE 03/2016 30gm (slightly large for age) but smooth and w/o cysts. NO GU trauama. PVR "60mL" (normal). Pt c/o gross hematuria Mar 2018. CT was normal 07/01/2016, although there was an atypical right parapelvic appearing cyst or an extrarenal pelvis that did not fill with contrast. A f/u renal US also appeared to show a right extrarenal pelvis. Cystoscopy was normal Jul 07, 2016.  He has not had any recent gross hematuria or blood in the semen.   Treated with tamsulosin and finasteride for BPH with LUTS  Recommendations by Dr. Milinda Cave : Patient's urinary picture is not straightforward as his cystoscopy in April 2018 did not demonstrate obstructive findings although patient's urinary complaints appear to be more of an obstructive nature and his PVRs have been minimal -he has not responded favorably with tamsulosin and the finasteride as his symptoms still persists -we will pursue UDS for an objective evaluation of his lower urinary symptoms at this time             - Continue conservative management, avoiding bladder irritants and timed voiding's             - most bothersome symptoms is/are frequency and post - void dribbling    - schedule UDS  Review of Systems  Constitutional: Negative for fatigue, fever and unexpected weight change.  HENT: Negative for congestion, ear pain, postnasal drip, rhinorrhea, sore throat and trouble swallowing.   Eyes: Negative for pain.  Respiratory: Negative for cough, shortness of breath and wheezing.   Cardiovascular: Negative for chest pain, palpitations and leg swelling.  Gastrointestinal: Negative for abdominal pain, blood in stool, constipation, diarrhea and nausea.  Genitourinary: Positive for discharge. Negative for difficulty urinating, dysuria, frequency, hematuria, penile pain, penile swelling, scrotal swelling, testicular pain and urgency.  Skin: Negative for rash.  Neurological: Negative for syncope, weakness, light-headedness, numbness and headaches.  Psychiatric/Behavioral: Negative for behavioral problems and dysphoric mood. The patient is not nervous/anxious.        Objective:   Physical Exam  Constitutional: Vital signs are normal. He appears well-developed and well-nourished.  HENT:  Head: Normocephalic.  Right Ear: Hearing normal.  Left Ear: Hearing normal.  Nose: Nose normal.  Mouth/Throat: Oropharynx is clear and moist and mucous membranes are normal.  Neck: Trachea normal. Carotid bruit is not present. No thyroid mass and no thyromegaly present.  Cardiovascular: Normal rate, regular rhythm and normal pulses. Exam reveals no gallop, no distant heart sounds and no friction rub.  No murmur heard. No peripheral edema  Pulmonary/Chest: Effort normal and breath sounds normal. No respiratory distress.  Abdominal: Hernia confirmed negative in  the right inguinal area and confirmed negative in the left inguinal area.  Genitourinary: Testes normal and penis normal. Right testis shows no mass. Left testis shows no mass. Circumcised. No phimosis, hypospadias, penile erythema or penile tenderness. No discharge found.  Skin: Skin is warm, dry and intact. No rash noted.   Psychiatric: He has a normal mood and affect. His speech is normal and behavior is normal. Thought content normal.          Assessment & Plan:

## 2017-10-16 NOTE — Assessment & Plan Note (Addendum)
Recent testing negative and this seem to be a recurrent issue for him.  Encouraged him to wear condoms.  Given recent different partner.. Will re-eval urine test for GC/CH/trich. Include RPR. Not interested in HIV testing or hepatitis testing.  May be physiologic discharge vs other.

## 2017-10-20 LAB — TRICHOMONAS VAGINALIS RNA, QL,MALES: Trichomonas vaginalis RNA: NOT DETECTED

## 2017-10-20 LAB — C. TRACHOMATIS/N. GONORRHOEAE RNA
C. TRACHOMATIS RNA, TMA: NOT DETECTED
N. GONORRHOEAE RNA, TMA: NOT DETECTED

## 2017-10-20 LAB — RPR: RPR Ser Ql: NONREACTIVE

## 2017-10-23 DIAGNOSIS — M25512 Pain in left shoulder: Secondary | ICD-10-CM | POA: Diagnosis not present

## 2017-10-23 DIAGNOSIS — M75122 Complete rotator cuff tear or rupture of left shoulder, not specified as traumatic: Secondary | ICD-10-CM | POA: Diagnosis not present

## 2017-10-23 DIAGNOSIS — M25712 Osteophyte, left shoulder: Secondary | ICD-10-CM | POA: Diagnosis not present

## 2017-10-23 DIAGNOSIS — M89312 Hypertrophy of bone, left shoulder: Secondary | ICD-10-CM | POA: Diagnosis not present

## 2017-10-23 DIAGNOSIS — M7542 Impingement syndrome of left shoulder: Secondary | ICD-10-CM | POA: Diagnosis not present

## 2017-10-23 DIAGNOSIS — M24112 Other articular cartilage disorders, left shoulder: Secondary | ICD-10-CM | POA: Diagnosis not present

## 2017-10-23 DIAGNOSIS — M898X1 Other specified disorders of bone, shoulder: Secondary | ICD-10-CM | POA: Diagnosis not present

## 2017-10-23 DIAGNOSIS — G8918 Other acute postprocedural pain: Secondary | ICD-10-CM | POA: Diagnosis not present

## 2017-11-03 ENCOUNTER — Other Ambulatory Visit: Payer: BLUE CROSS/BLUE SHIELD

## 2017-11-03 ENCOUNTER — Telehealth: Payer: Self-pay | Admitting: Urology

## 2017-11-03 DIAGNOSIS — R310 Gross hematuria: Secondary | ICD-10-CM | POA: Diagnosis not present

## 2017-11-03 LAB — URINALYSIS, COMPLETE
Bilirubin, UA: NEGATIVE
Glucose, UA: NEGATIVE
Ketones, UA: NEGATIVE
LEUKOCYTES UA: NEGATIVE
Nitrite, UA: NEGATIVE
PH UA: 6 (ref 5.0–7.5)
Protein, UA: NEGATIVE
RBC, UA: NEGATIVE
Specific Gravity, UA: 1.02 (ref 1.005–1.030)
Urobilinogen, Ur: 0.2 mg/dL (ref 0.2–1.0)

## 2017-11-03 LAB — MICROSCOPIC EXAMINATION
BACTERIA UA: NONE SEEN
EPITHELIAL CELLS (NON RENAL): NONE SEEN /HPF (ref 0–10)
RBC, UA: NONE SEEN /hpf (ref 0–2)

## 2017-11-03 NOTE — Addendum Note (Signed)
Addended by: Frankey ShownGLANTON, Tedford Berg C on: 11/03/2017 01:01 PM   Modules accepted: Orders

## 2017-11-03 NOTE — Telephone Encounter (Signed)
[facility] states that he has noticed blood in his urine and semen and pressure since his surgery 10/23/2017, left shoulder surgery.  Pt would like to speak to someone since shannon is not available. Please contact pt at [phone].

## 2017-11-03 NOTE — Telephone Encounter (Signed)
Called pt he states that he has noticed a few episodes of hematuria since shoulder surgery a week ago. Pt states that he believes he may have a UTI. Advised pt that he was due to bring a urine specimen in per Shannon's last note. Pt states that he will come by today or tommorow to bring a UA, advised pt that after reviewing UA we will advise on next steps. Orders placed.

## 2017-11-05 LAB — CULTURE, URINE COMPREHENSIVE

## 2017-11-30 DIAGNOSIS — S46012D Strain of muscle(s) and tendon(s) of the rotator cuff of left shoulder, subsequent encounter: Secondary | ICD-10-CM | POA: Diagnosis not present

## 2017-11-30 DIAGNOSIS — M25562 Pain in left knee: Secondary | ICD-10-CM | POA: Diagnosis not present

## 2017-12-04 DIAGNOSIS — S46012D Strain of muscle(s) and tendon(s) of the rotator cuff of left shoulder, subsequent encounter: Secondary | ICD-10-CM | POA: Diagnosis not present

## 2017-12-04 DIAGNOSIS — M25562 Pain in left knee: Secondary | ICD-10-CM | POA: Diagnosis not present

## 2017-12-07 DIAGNOSIS — M25562 Pain in left knee: Secondary | ICD-10-CM | POA: Diagnosis not present

## 2017-12-07 DIAGNOSIS — S46012D Strain of muscle(s) and tendon(s) of the rotator cuff of left shoulder, subsequent encounter: Secondary | ICD-10-CM | POA: Diagnosis not present

## 2017-12-09 DIAGNOSIS — S46012D Strain of muscle(s) and tendon(s) of the rotator cuff of left shoulder, subsequent encounter: Secondary | ICD-10-CM | POA: Diagnosis not present

## 2017-12-09 DIAGNOSIS — M25562 Pain in left knee: Secondary | ICD-10-CM | POA: Diagnosis not present

## 2017-12-14 DIAGNOSIS — M25562 Pain in left knee: Secondary | ICD-10-CM | POA: Diagnosis not present

## 2017-12-14 DIAGNOSIS — S46012D Strain of muscle(s) and tendon(s) of the rotator cuff of left shoulder, subsequent encounter: Secondary | ICD-10-CM | POA: Diagnosis not present

## 2017-12-17 DIAGNOSIS — S46012D Strain of muscle(s) and tendon(s) of the rotator cuff of left shoulder, subsequent encounter: Secondary | ICD-10-CM | POA: Diagnosis not present

## 2017-12-17 DIAGNOSIS — M25562 Pain in left knee: Secondary | ICD-10-CM | POA: Diagnosis not present

## 2017-12-22 ENCOUNTER — Ambulatory Visit (INDEPENDENT_AMBULATORY_CARE_PROVIDER_SITE_OTHER): Payer: BLUE CROSS/BLUE SHIELD | Admitting: Primary Care

## 2017-12-22 ENCOUNTER — Encounter: Payer: Self-pay | Admitting: Primary Care

## 2017-12-22 VITALS — BP 122/78 | HR 73 | Temp 98.3°F | Ht 71.0 in | Wt 169.5 lb

## 2017-12-22 DIAGNOSIS — Z23 Encounter for immunization: Secondary | ICD-10-CM

## 2017-12-22 DIAGNOSIS — Z0000 Encounter for general adult medical examination without abnormal findings: Secondary | ICD-10-CM

## 2017-12-22 DIAGNOSIS — M542 Cervicalgia: Secondary | ICD-10-CM

## 2017-12-22 DIAGNOSIS — R739 Hyperglycemia, unspecified: Secondary | ICD-10-CM | POA: Diagnosis not present

## 2017-12-22 DIAGNOSIS — E0590 Thyrotoxicosis, unspecified without thyrotoxic crisis or storm: Secondary | ICD-10-CM | POA: Diagnosis not present

## 2017-12-22 DIAGNOSIS — R3912 Poor urinary stream: Secondary | ICD-10-CM

## 2017-12-22 DIAGNOSIS — I10 Essential (primary) hypertension: Secondary | ICD-10-CM

## 2017-12-22 DIAGNOSIS — M549 Dorsalgia, unspecified: Secondary | ICD-10-CM

## 2017-12-22 DIAGNOSIS — G8929 Other chronic pain: Secondary | ICD-10-CM

## 2017-12-22 LAB — COMPREHENSIVE METABOLIC PANEL
ALT: 15 U/L (ref 0–53)
AST: 24 U/L (ref 0–37)
Albumin: 4.8 g/dL (ref 3.5–5.2)
Alkaline Phosphatase: 93 U/L (ref 39–117)
BILIRUBIN TOTAL: 0.6 mg/dL (ref 0.2–1.2)
BUN: 20 mg/dL (ref 6–23)
CHLORIDE: 101 meq/L (ref 96–112)
CO2: 32 meq/L (ref 19–32)
Calcium: 9.9 mg/dL (ref 8.4–10.5)
Creatinine, Ser: 1.08 mg/dL (ref 0.40–1.50)
GFR: 99.68 mL/min (ref >60.00)
GLUCOSE: 92 mg/dL (ref 70–99)
Potassium: 4.5 mEq/L (ref 3.5–5.1)
SODIUM: 138 meq/L (ref 135–145)
Total Protein: 7.7 g/dL (ref 6.0–8.3)

## 2017-12-22 LAB — LIPID PANEL
CHOL/HDL RATIO: 3
Cholesterol: 158 mg/dL (ref 0–200)
HDL: 61.7 mg/dL (ref >39.00)
LDL CALC: 82 mg/dL (ref 0–99)
NONHDL: 95.89
Triglycerides: 68 mg/dL (ref 0.0–149.0)
VLDL: 13.6 mg/dL (ref 0.0–40.0)

## 2017-12-22 LAB — HEMOGLOBIN A1C: Hgb A1c MFr Bld: 5.1 % (ref 4.6–6.5)

## 2017-12-22 NOTE — Addendum Note (Signed)
Addended by: Tawnya Crook on: 12/22/2017 12:17 PM   Modules accepted: Orders

## 2017-12-22 NOTE — Patient Instructions (Signed)
Stop by the lab prior to leaving today. I will notify you of your results once received.   Start exercising. You should be getting 150 minutes of exercise weekly.  It's important to improve your diet by reducing consumption of fast food, fried food, processed snack foods, sugary drinks. Increase consumption of fresh vegetables and fruits, whole grains, water.  Ensure you are drinking 64 ounces of water daily.  We will see you next year for your annual exam or sooner if needed.  It was a pleasure to see you today!   Preventive Care 18-39 Years, Male Preventive care refers to lifestyle choices and visits with your health care provider that can promote health and wellness. What does preventive care include?  A yearly physical exam. This is also called an annual well check.  Dental exams once or twice a year.  Routine eye exams. Ask your health care provider how often you should have your eyes checked.  Personal lifestyle choices, including: ? Daily care of your teeth and gums. ? Regular physical activity. ? Eating a healthy diet. ? Avoiding tobacco and drug use. ? Limiting alcohol use. ? Practicing safe sex. What happens during an annual well check? The services and screenings done by your health care provider during your annual well check will depend on your age, overall health, lifestyle risk factors, and family history of disease. Counseling Your health care provider may ask you questions about your:  Alcohol use.  Tobacco use.  Drug use.  Emotional well-being.  Home and relationship well-being.  Sexual activity.  Eating habits.  Work and work Statistician.  Screening You may have the following tests or measurements:  Height, weight, and BMI.  Blood pressure.  Lipid and cholesterol levels. These may be checked every 5 years starting at age 14.  Diabetes screening. This is done by checking your blood sugar (glucose) after you have not eaten for a while  (fasting).  Skin check.  Hepatitis C blood test.  Hepatitis B blood test.  Sexually transmitted disease (STD) testing.  Discuss your test results, treatment options, and if necessary, the need for more tests with your health care provider. Vaccines Your health care provider may recommend certain vaccines, such as:  Influenza vaccine. This is recommended every year.  Tetanus, diphtheria, and acellular pertussis (Tdap, Td) vaccine. You may need a Td booster every 10 years.  Varicella vaccine. You may need this if you have not been vaccinated.  HPV vaccine. If you are 25 or younger, you may need three doses over 6 months.  Measles, mumps, and rubella (MMR) vaccine. You may need at least one dose of MMR.You may also need a second dose.  Pneumococcal 13-valent conjugate (PCV13) vaccine. You may need this if you have certain conditions and have not been vaccinated.  Pneumococcal polysaccharide (PPSV23) vaccine. You may need one or two doses if you smoke cigarettes or if you have certain conditions.  Meningococcal vaccine. One dose is recommended if you are age 27-21 years and a first-year college student living in a residence hall, or if you have one of several medical conditions. You may also need additional booster doses.  Hepatitis A vaccine. You may need this if you have certain conditions or if you travel or work in places where you may be exposed to hepatitis A.  Hepatitis B vaccine. You may need this if you have certain conditions or if you travel or work in places where you may be exposed to hepatitis B.  Haemophilus  influenzae type b (Hib) vaccine. You may need this if you have certain risk factors.  Talk to your health care provider about which screenings and vaccines you need and how often you need them. This information is not intended to replace advice given to you by your health care provider. Make sure you discuss any questions you have with your health care  provider. Document Released: 04/22/2001 Document Revised: 11/14/2015 Document Reviewed: 12/26/2014 Elsevier Interactive Patient Education  [employer].

## 2017-12-22 NOTE — Assessment & Plan Note (Signed)
Overall doing well since surgery in 2018.

## 2017-12-22 NOTE — Assessment & Plan Note (Signed)
Following with endocrinology, continue methimazole.

## 2017-12-22 NOTE — Assessment & Plan Note (Signed)
Following with Urology, managed on finasteride and tamsulosin, continue same.
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
## 2017-12-22 NOTE — Assessment & Plan Note (Signed)
Tetanus UTD, influenza vaccination provided today. Recommended to work on diet, start to incorporate regular exercise. Exam stable. Labs pending. Follow up in 1 year for CPE.

## 2017-12-23 DIAGNOSIS — M25562 Pain in left knee: Secondary | ICD-10-CM | POA: Diagnosis not present

## 2017-12-23 DIAGNOSIS — S46012D Strain of muscle(s) and tendon(s) of the rotator cuff of left shoulder, subsequent encounter: Secondary | ICD-10-CM | POA: Diagnosis not present

## 2017-12-29 DIAGNOSIS — S46012D Strain of muscle(s) and tendon(s) of the rotator cuff of left shoulder, subsequent encounter: Secondary | ICD-10-CM | POA: Diagnosis not present

## 2017-12-29 DIAGNOSIS — M25562 Pain in left knee: Secondary | ICD-10-CM | POA: Diagnosis not present

## 2018-01-07 ENCOUNTER — Encounter: Payer: Self-pay | Admitting: Urology

## 2018-01-07 ENCOUNTER — Ambulatory Visit (INDEPENDENT_AMBULATORY_CARE_PROVIDER_SITE_OTHER): Payer: BLUE CROSS/BLUE SHIELD | Admitting: Urology

## 2018-01-07 VITALS — BP 129/84 | HR 70 | Ht 71.0 in | Wt 156.9 lb

## 2018-01-07 DIAGNOSIS — Z8042 Family history of malignant neoplasm of prostate: Secondary | ICD-10-CM

## 2018-01-07 DIAGNOSIS — N401 Enlarged prostate with lower urinary tract symptoms: Secondary | ICD-10-CM | POA: Diagnosis not present

## 2018-01-07 DIAGNOSIS — R361 Hematospermia: Secondary | ICD-10-CM

## 2018-01-07 DIAGNOSIS — M6289 Other specified disorders of muscle: Secondary | ICD-10-CM | POA: Diagnosis not present

## 2018-01-07 DIAGNOSIS — N138 Other obstructive and reflux uropathy: Secondary | ICD-10-CM

## 2018-01-07 DIAGNOSIS — N281 Cyst of kidney, acquired: Secondary | ICD-10-CM

## 2018-01-07 LAB — MICROSCOPIC EXAMINATION
EPITHELIAL CELLS (NON RENAL): NONE SEEN /HPF (ref 0–10)
RBC MICROSCOPIC, UA: NONE SEEN /HPF (ref 0–2)
WBC, UA: NONE SEEN /hpf (ref 0–5)

## 2018-01-07 LAB — URINALYSIS, COMPLETE
Bilirubin, UA: NEGATIVE
Glucose, UA: NEGATIVE
Ketones, UA: NEGATIVE
Leukocytes, UA: NEGATIVE
NITRITE UA: NEGATIVE
PH UA: 6 (ref 5.0–7.5)
Protein, UA: NEGATIVE
RBC, UA: NEGATIVE
Specific Gravity, UA: 1.02 (ref 1.005–1.030)
Urobilinogen, Ur: 0.2 mg/dL (ref 0.2–1.0)

## 2018-01-07 LAB — BLADDER SCAN AMB NON-IMAGING: Scan Result: 36

## 2018-01-07 MED ORDER — MIRABEGRON ER 25 MG PO TB24: 25.0000 mg | ORAL_TABLET | Freq: Every day | ORAL | 0 refills | Status: DC
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
## 2018-01-08 LAB — PSA: PROSTATE SPECIFIC AG, SERUM: 0.7 ng/mL (ref 0.0–4.0)

## 2018-01-12 DIAGNOSIS — S46012D Strain of muscle(s) and tendon(s) of the rotator cuff of left shoulder, subsequent encounter: Secondary | ICD-10-CM | POA: Diagnosis not present

## 2018-01-12 DIAGNOSIS — M25562 Pain in left knee: Secondary | ICD-10-CM | POA: Diagnosis not present

## 2018-01-13 DIAGNOSIS — E0590 Thyrotoxicosis, unspecified without thyrotoxic crisis or storm: Secondary | ICD-10-CM | POA: Diagnosis not present

## 2018-01-14 ENCOUNTER — Ambulatory Visit
Admission: RE | Admit: 2018-01-14 | Discharge: 2018-01-14 | Disposition: A | Discharge status: Home / Self Care | Payer: BLUE CROSS/BLUE SHIELD | Source: Ambulatory Visit | Attending: Urology | Admitting: Urology

## 2018-01-14 DIAGNOSIS — N281 Cyst of kidney, acquired: Secondary | ICD-10-CM | POA: Insufficient documentation

## 2018-01-19 DIAGNOSIS — M25562 Pain in left knee: Secondary | ICD-10-CM | POA: Diagnosis not present

## 2018-01-19 DIAGNOSIS — S46012D Strain of muscle(s) and tendon(s) of the rotator cuff of left shoulder, subsequent encounter: Secondary | ICD-10-CM | POA: Diagnosis not present

## 2018-01-28 ENCOUNTER — Ambulatory Visit: Payer: BLUE CROSS/BLUE SHIELD | Admitting: Urology

## 2018-01-28 DIAGNOSIS — S46012D Strain of muscle(s) and tendon(s) of the rotator cuff of left shoulder, subsequent encounter: Secondary | ICD-10-CM | POA: Diagnosis not present

## 2018-01-28 DIAGNOSIS — M25562 Pain in left knee: Secondary | ICD-10-CM | POA: Diagnosis not present

## 2018-02-01 DIAGNOSIS — F419 Anxiety disorder, unspecified: Secondary | ICD-10-CM | POA: Diagnosis not present

## 2018-02-10 ENCOUNTER — Ambulatory Visit: Payer: BLUE CROSS/BLUE SHIELD | Admitting: Urology

## 2018-02-11 ENCOUNTER — Encounter: Payer: Self-pay | Admitting: Primary Care

## 2018-02-11 ENCOUNTER — Ambulatory Visit: Payer: BLUE CROSS/BLUE SHIELD | Admitting: Primary Care

## 2018-02-11 VITALS — BP 118/82 | HR 76 | Temp 98.0°F | Ht 71.0 in | Wt 162.0 lb

## 2018-02-11 DIAGNOSIS — Z113 Encounter for screening for infections with a predominantly sexual mode of transmission: Secondary | ICD-10-CM | POA: Diagnosis not present

## 2018-02-11 NOTE — Patient Instructions (Signed)
We will be in touch once we receive your results.  It was a pleasure to see you today!
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
## 2018-02-11 NOTE — Assessment & Plan Note (Signed)
Unprotected intercourse with same partner. No symptoms. Check labs today including gonorrhea, chlamydia, trichomonas, HIV, HSV, RPR.

## 2018-02-14 LAB — TRICHOMONAS VAGINALIS RNA, QL,MALES: Trichomonas vaginalis RNA: NOT DETECTED

## 2018-02-14 LAB — HSV 1/2 AB (IGM), IFA W/RFLX TITER
HSV 1 IGM SCREEN: NEGATIVE
HSV 2 IgM Screen: NEGATIVE

## 2018-02-14 LAB — HIV ANTIBODY (ROUTINE TESTING W REFLEX): HIV 1&2 Ab, 4th Generation: NONREACTIVE

## 2018-02-14 LAB — HSV(HERPES SIMPLEX VRS) I + II AB-IGG
HSV 1 IGG, TYPE SPEC: 16.2 {index} — AB
HSV 2 IGG,TYPE SPECIFIC AB: <0.9 index

## 2018-02-14 LAB — C. TRACHOMATIS/N. GONORRHOEAE RNA
C. trachomatis RNA, TMA: NOT DETECTED
N. gonorrhoeae RNA, TMA: NOT DETECTED

## 2018-02-14 LAB — RPR: RPR: NONREACTIVE

## 2018-02-15 ENCOUNTER — Encounter: Payer: Self-pay | Admitting: *Deleted

## 2018-03-18 ENCOUNTER — Other Ambulatory Visit: Payer: Self-pay

## 2018-03-18 ENCOUNTER — Ambulatory Visit (INDEPENDENT_AMBULATORY_CARE_PROVIDER_SITE_OTHER): Payer: BLUE CROSS/BLUE SHIELD | Admitting: Urology

## 2018-03-18 ENCOUNTER — Encounter: Payer: Self-pay | Admitting: Urology

## 2018-03-18 VITALS — BP 152/90 | HR 58 | Ht 71.0 in | Wt 158.0 lb

## 2018-03-18 DIAGNOSIS — N138 Other obstructive and reflux uropathy: Secondary | ICD-10-CM

## 2018-03-18 DIAGNOSIS — N401 Enlarged prostate with lower urinary tract symptoms: Secondary | ICD-10-CM

## 2018-03-18 DIAGNOSIS — Z8042 Family history of malignant neoplasm of prostate: Secondary | ICD-10-CM

## 2018-03-18 LAB — BLADDER SCAN AMB NON-IMAGING
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
## 2018-03-22 ENCOUNTER — Telehealth: Payer: Self-pay | Admitting: Urology

## 2018-03-22 NOTE — Telephone Encounter (Signed)
Pt was seen last week, and states that he wants to be checked for STD, he also states that his urine smells strong. Wants a call back.

## 2018-03-22 NOTE — Telephone Encounter (Signed)
Spoke to patient and informed him to see his PCP for STD. If he is having urinary symptoms and feels he has a UTI he is welcome to schedule an appointment at our office. Patient voiced understanding and will call his PCP.

## 2018-03-23 ENCOUNTER — Ambulatory Visit: Payer: Self-pay | Admitting: Primary Care

## 2018-03-23 ENCOUNTER — Encounter: Payer: Self-pay | Admitting: Primary Care

## 2018-03-23 VITALS — BP 128/82 | HR 67 | Temp 98.3°F | Wt 157.5 lb

## 2018-03-23 DIAGNOSIS — L03221 Cellulitis of neck: Secondary | ICD-10-CM

## 2018-03-23 DIAGNOSIS — R369 Urethral discharge, unspecified: Secondary | ICD-10-CM

## 2018-03-23 DIAGNOSIS — R360 Urethral discharge without blood: Secondary | ICD-10-CM

## 2018-03-23 LAB — POC URINALSYSI DIPSTICK (AUTOMATED)
Bilirubin, UA: NEGATIVE
Blood, UA: NEGATIVE
Glucose, UA: NEGATIVE
Ketones, UA: NEGATIVE
LEUKOCYTES UA: NEGATIVE
Nitrite, UA: NEGATIVE
Protein, UA: NEGATIVE
Spec Grav, UA: 1.015 (ref 1.010–1.025)
Urobilinogen, UA: 0.2 E.U./dL
pH, UA: 6.5 (ref 5.0–8.0)

## 2018-03-23 MED ORDER — CEPHALEXIN 500 MG PO CAPS: 500.0000 mg | ORAL_CAPSULE | Freq: Two times a day (BID) | ORAL | 0 refills | Status: DC
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
## 2018-03-23 NOTE — Assessment & Plan Note (Signed)
No discharge noted on exam. UA unremarkable. Urine for gonorrhea, trichomonas, chlamydia pending.

## 2018-03-23 NOTE — Patient Instructions (Addendum)
Start Cephalexin antibiotics for the infection. Take 1 capsule by mouth twice daily for 7 days.  We will be in touch once we receive your urine results. You do not have an infection.  Please notify me if your skin gets redder and more painful after a few days with the antibiotics.  It was a pleasure to see you today!

## 2018-03-24 LAB — TRICHOMONAS VAGINALIS RNA, QL,MALES: Trichomonas vaginalis RNA: NOT DETECTED
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
## 2018-04-08 ENCOUNTER — Ambulatory Visit (INDEPENDENT_AMBULATORY_CARE_PROVIDER_SITE_OTHER): Payer: Self-pay | Admitting: Urology

## 2018-04-08 ENCOUNTER — Encounter: Payer: Self-pay | Admitting: Urology

## 2018-04-08 VITALS — BP 106/69 | HR 70 | Ht 71.0 in | Wt 157.0 lb

## 2018-04-08 DIAGNOSIS — N138 Other obstructive and reflux uropathy: Secondary | ICD-10-CM

## 2018-04-08 DIAGNOSIS — Z8042 Family history of malignant neoplasm of prostate: Secondary | ICD-10-CM

## 2018-04-08 DIAGNOSIS — N401 Enlarged prostate with lower urinary tract symptoms: Secondary | ICD-10-CM

## 2018-04-08 LAB — BLADDER SCAN AMB NON-IMAGING: SCAN RESULT: 2

## 2018-05-01 DIAGNOSIS — R238 Other skin changes: Secondary | ICD-10-CM

## 2018-05-03 ENCOUNTER — Ambulatory Visit: Payer: Self-pay | Admitting: Internal Medicine

## 2018-05-03 ENCOUNTER — Encounter: Payer: Self-pay | Admitting: Internal Medicine

## 2018-05-03 VITALS — BP 138/80 | HR 75 | Temp 98.2°F | Resp 16 | Wt 159.0 lb

## 2018-05-03 DIAGNOSIS — L03221 Cellulitis of neck: Secondary | ICD-10-CM

## 2018-05-03 MED ORDER — CEPHALEXIN 500 MG PO CAPS: 500.0000 mg | ORAL_CAPSULE | Freq: Three times a day (TID) | ORAL | 0 refills | Status: DC

## 2018-05-03 NOTE — Progress Notes (Signed)
Subjective:    Patient ID: Eric Shannon, male    DOB: [birth year], 38 y.o.   MRN: [id number]  HPI  Pt presents to the clinic today with c/o blisters and redness to the back of his neck. He noticed this 2 days ago. He reports the area itches. He has not noticed any drainage from the area. He does not recall getting bitten by anything. He denies fever, chills or body aches. He has not taken anything OTC.  Review of Systems  Past Medical History:  Diagnosis Date  . Asthma   . Chickenpox   . Elevated blood pressure   . Migraine     Current Outpatient Medications  Medication Sig Dispense Refill  . azelastine (OPTIVAR) 0.05 % ophthalmic solution Place 1 drop into both eyes 2 (two) times daily. 6 mL 12  . cetirizine (ZYRTEC) 10 MG tablet Take 1 tablet by mouth every night at bedtime for post nasal drip. 90 tablet 0  . cyclobenzaprine (FLEXERIL) 10 MG tablet Take 10 mg by mouth every 8 (eight) hours as needed.  0  . finasteride (PROSCAR) 5 MG tablet Take 1 tablet (5 mg total) by mouth daily. 90 tablet 3  . fluticasone (FLONASE) 50 MCG/ACT nasal spray Place 2 sprays into both nostrils daily. 16 g 6  . methimazole (TAPAZOLE) 5 MG tablet Take 5 mg by mouth 3 (three) times daily.    . mirabegron ER (MYRBETRIQ) 25 MG TB24 tablet Take 1 tablet (25 mg total) by mouth daily. 1 tablet 0  . Multiple Vitamin (MULTI-VITAMINS) TABS Take by mouth.    . oxyCODONE (OXY IR/ROXICODONE) 5 MG immediate release tablet Take 1-3 tabs PO q4-6h prn post-op pain    . tamsulosin (FLOMAX) 0.4 MG CAPS capsule Take 1 capsule (0.4 mg total) by mouth daily. 90 capsule 3  . cephALEXin (KEFLEX) 500 MG capsule Take 1 capsule (500 mg total) by mouth 3 (three) times daily. 21 capsule 0   No current facility-administered medications for this visit.     No Known Allergies  Family History  Problem Relation Age of Onset  . Hypertension Father   . Hypertension Mother   . Hyperthyroidism Mother   . Prostate cancer  Maternal Grandfather   . Prostate cancer Maternal Uncle   . Prostate cancer Paternal Uncle   . Kidney cancer Neg Hx   . Bladder Cancer Neg Hx     Social History   Socioeconomic History  . Marital status: Single    Spouse name: Not on file  . Number of children: Not on file  . Years of education: Not on file  . Highest education level: Not on file  Occupational History  . Not on file  Social Needs  . Financial resource strain: Not on file  . Food insecurity:    Worry: Not on file    Inability: Not on file  . Transportation needs:    Medical: Not on file    Non-medical: Not on file  Tobacco Use  . Smoking status: Former Smoker    Last attempt to quit: 04/05/1999    Years since quitting: 19.0  . Smokeless tobacco: Never Used  Substance and Sexual Activity  . Alcohol use: Yes    Alcohol/week: 0.0 standard drinks    Comment: social  . Drug use: No  . Sexual activity: Yes  Lifestyle  . Physical activity:    Days per week: Not on file    Minutes per session: Not on file  .  Stress: Not at all  Relationships  . Social connections:    Talks on phone: Not on file    Gets together: Not on file    Attends religious service: Not on file    Active member of club or organization: Not on file    Attends meetings of clubs or organizations: Not on file    Relationship status: Not on file  . Intimate partner violence:    Fear of current or ex partner: Not on file    Emotionally abused: Not on file    Physically abused: Not on file    Forced sexual activity: Not on file  Other Topics Concern  . Not on file  Social History Narrative   Single.   1 daughter.   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   Enjoys spending time with his daughter.     Constitutional: Denies fever, malaise, fatigue, headache or abrupt weight changes.  Respiratory: Denies difficulty breathing, shortness of breath, cough or sputum production.   Cardiovascular: Denies chest pain, chest tightness,  palpitations or swelling in the hands or feet.  Skin: Pt reports blisters/redness to nape of neck. Denies ulcercations.    No other specific complaints in a complete review of systems (except as listed in HPI above).     Objective:   Physical Exam  BP 138/80   Pulse 75   Temp 98.2 F (36.8 C) (Oral)   Resp 16   Wt 159 lb (72.1 kg)   SpO2 98%   BMI 22.18 kg/m  Wt Readings from Last 3 Encounters:  05/03/18 159 lb (72.1 kg)  04/08/18 157 lb (71.2 kg)  03/23/18 157 lb 8 oz (71.4 kg)    General: Appears his stated age, well developed, well nourished in NAD. Skin: 2 cm fluid filled blister with 4 cm x 3 cm area of surrounding cellulitis. Cardiovascular: Normal rate and rhythm. S1,S2 noted.  No murmur, rubs or gallops noted.  Pulmonary/Chest: Normal effort and positive vesicular breath sounds. No respiratory distress. No wheezes, rales or ronchi noted.  Neurological: Alert and oriented.    BMET    Component Value Date/Time   NA 138 12/22/2017 0902   K 4.5 12/22/2017 0902   CL 101 12/22/2017 0902   CO2 32 12/22/2017 0902   GLUCOSE 92 12/22/2017 0902   BUN 20 12/22/2017 0902   CREATININE 1.08 12/22/2017 0902   CALCIUM 9.9 12/22/2017 0902    Lipid Panel     Component Value Date/Time   CHOL 158 12/22/2017 0902   TRIG 68.0 12/22/2017 0902   HDL 61.70 12/22/2017 0902   CHOLHDL 3 12/22/2017 0902   VLDL 13.6 12/22/2017 0902   LDLCALC 82 12/22/2017 0902    CBC    Component Value Date/Time   WBC 11.6 (H) 05/14/2015 0935   RBC 5.25 05/14/2015 0935   HGB 15.9 05/14/2015 0935   HCT 46.8 05/14/2015 0935   PLT 209.0 05/14/2015 0935   MCV 89.0 05/14/2015 0935   MCHC 34.1 05/14/2015 0935   RDW 13.7 05/14/2015 0935    Hgb A1C Lab Results  Component Value Date   HGBA1C 5.1 12/22/2017            Assessment & Plan:   Cellulitis of Neck:  RX for Keflex 500 mg TID x 7 days Can take Zyrtec as needed for itching Monitor for increased pain, redness, confusion,  fever, chills or body aches  Return precautions discussed Nicki Reaper, NP

## 2018-05-03 NOTE — Patient Instructions (Signed)

## 2018-08-13 ENCOUNTER — Other Ambulatory Visit: Payer: Self-pay | Admitting: Primary Care

## 2018-08-13 ENCOUNTER — Ambulatory Visit (INDEPENDENT_AMBULATORY_CARE_PROVIDER_SITE_OTHER): Payer: Self-pay | Admitting: Primary Care

## 2018-08-13 ENCOUNTER — Other Ambulatory Visit: Payer: Self-pay

## 2018-08-13 DIAGNOSIS — Z113 Encounter for screening for infections with a predominantly sexual mode of transmission: Secondary | ICD-10-CM

## 2018-08-13 NOTE — Assessment & Plan Note (Signed)
New partner, using protection during intercourse. No symptoms as of now. Check UA for gonorrhea/chlamydia and trichomonas. He declines other testing including HIV, RPR, etc.

## 2018-08-13 NOTE — Patient Instructions (Signed)
Call the main line to schedule a lab appointment for your STD testing.  Always wear a condom during intercourse.   Please notify the Urologist if your bleeding returns.  It was a pleasure to see you today! Mayra Reel, NP-C
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
## 2018-08-16 ENCOUNTER — Other Ambulatory Visit: Payer: Self-pay

## 2018-08-16 NOTE — Addendum Note (Signed)
Addended by: Ellamae Sia on: 08/16/2018 11:20 AM   Modules accepted: Orders

## 2018-08-18 LAB — C. TRACHOMATIS/N. GONORRHOEAE RNA
C. trachomatis RNA, TMA: NOT DETECTED
N. gonorrhoeae RNA, TMA: NOT DETECTED

## 2018-08-18 LAB — TRICHOMONAS VAGINALIS RNA, QL,MALES: Trichomonas vaginalis RNA: NOT DETECTED

## 2018-11-26 ENCOUNTER — Other Ambulatory Visit: Payer: Self-pay

## 2018-11-26 ENCOUNTER — Encounter: Payer: Self-pay | Admitting: Family Medicine

## 2018-11-26 ENCOUNTER — Ambulatory Visit (INDEPENDENT_AMBULATORY_CARE_PROVIDER_SITE_OTHER): Payer: Self-pay | Admitting: Family Medicine

## 2018-11-26 VITALS — BP 130/90 | HR 81 | Temp 97.9°F | Ht 71.0 in | Wt 162.2 lb

## 2018-11-26 DIAGNOSIS — M545 Low back pain, unspecified: Secondary | ICD-10-CM | POA: Insufficient documentation

## 2018-11-26 DIAGNOSIS — R8290 Unspecified abnormal findings in urine: Secondary | ICD-10-CM

## 2018-11-26 DIAGNOSIS — R369 Urethral discharge, unspecified: Secondary | ICD-10-CM

## 2018-11-26 DIAGNOSIS — R360 Urethral discharge without blood: Secondary | ICD-10-CM

## 2018-11-26 DIAGNOSIS — N4889 Other specified disorders of penis: Secondary | ICD-10-CM | POA: Insufficient documentation

## 2018-11-26 HISTORY — DX: Other specified disorders of penis: N48.89

## 2018-11-26 LAB — POC URINALSYSI DIPSTICK (AUTOMATED)
Bilirubin, UA: NEGATIVE
Blood, UA: NEGATIVE
Glucose, UA: NEGATIVE
Ketones, UA: NEGATIVE
Leukocytes, UA: NEGATIVE
Nitrite, UA: NEGATIVE
Protein, UA: NEGATIVE
Spec Grav, UA: 1.01 (ref 1.010–1.025)
Urobilinogen, UA: 0.2 E.U./dL
pH, UA: 6 (ref 5.0–8.0)

## 2018-11-26 NOTE — Patient Instructions (Addendum)
Start ibuprofen 800 mg three times daily.  Heat on low back and start home physical therapy.  Call if not improving in next  2 weeks.  We will call with test results.

## 2018-11-26 NOTE — Progress Notes (Signed)
Chief Complaint  Patient presents with  . Back Pain  . Abnormal Urine Odor    History of Present Illness: HPI 38 year old male pt of  Allie Bossier presents with new onset low back pain x 24 hours.  He has been picking up 80 year old daughter.. 53 lbs. Constant  pain. No numbness, no weakness.  No fall, no known injury. He applied Biofreeze, ice pack.. no change.  No radiation of pain.  Hx of  Spinal arachnoid cyst in thoracic spine, chiari malformation and cervical surgery 2 years ago.  He has history of enlarged prostate.. has clear penile discharge, off and on white discharge.  Sees urologist.. told  increased secretions lans of [place]att[place]..  Had neg GC/Chl and trich in 08/2018 Odor in urine.  no fever, no dysuria, no penile itching.  no perineal pain.  Has had new sexual partners in last few months. Wants STD testing.  He has several spots on penis.. present years.. no itching.. has been check out before. Requests  My opinion.  UA done because of odor in urine.  Urinalysis    Component Value Date/Time   APPEARANCEUR Clear 01/07/2018 1019   GLUCOSEU Negative 01/07/2018 1019   BILIRUBINUR Negative 11/26/2018 0927   BILIRUBINUR Negative 01/07/2018 1019   PROTEINUR Negative 11/26/2018 0927   PROTEINUR Negative 01/07/2018 1019   UROBILINOGEN 0.2 11/26/2018 0927   NITRITE Negative 11/26/2018 0927   NITRITE Negative 01/07/2018 1019   LEUKOCYTESUR Negative 11/26/2018 0927   LEUKOCYTESUR Negative 01/07/2018 1019      COVID 19 screen No recent travel or known exposure to COVID19 The patient denies respiratory symptoms of COVID 19 at this time.  The importance of social distancing was discussed today.   Review of Systems  Constitutional: Negative for chills and fever.  HENT: Negative for congestion and ear pain.   Eyes: Negative for pain and redness.  Respiratory: Negative for cough and shortness of breath.   Cardiovascular: Negative for chest pain, palpitations and leg  swelling.  Gastrointestinal: Negative for abdominal pain, blood in stool, constipation, diarrhea, nausea and vomiting.  Genitourinary: Negative for dysuria.  Musculoskeletal: Positive for back pain. Negative for falls and myalgias.  Skin: Negative for rash.  Neurological: Negative for dizziness and headaches.  Psychiatric/Behavioral: Negative for depression. The patient is not nervous/anxious.       Past Medical History:  Diagnosis Date  . Asthma   . Chickenpox   . Elevated blood pressure   . Migraine     reports that he quit smoking about 19 years ago. He has never used smokeless tobacco. He reports current alcohol use. He reports that he does not use drugs.   Current Outpatient Medications:  .  azelastine (OPTIVAR) 0.05 % ophthalmic solution, Place 1 drop into both eyes 2 (two) times daily., Disp: 6 mL, Rfl: 12 .  fluticasone (FLONASE) 50 MCG/ACT nasal spray, Place 2 sprays into both nostrils daily., Disp: 16 g, Rfl: 6 .  methimazole (TAPAZOLE) 5 MG tablet, Take 5 mg by mouth 3 (three) times daily., Disp: , Rfl:  .  Multiple Vitamin (MULTI-VITAMINS) TABS, Take by mouth., Disp: , Rfl:    Observations/Objective: Blood pressure 130/90, pulse 81, temperature 97.9 F (36.6 C), temperature source Temporal, height 5\' 11"  (1.803 m), weight 162 lb 4 oz (73.6 kg), SpO2 98 %.  Physical Exam Constitutional:      Appearance: He is well-developed.  HENT:     Head: Normocephalic.     Right Ear: Hearing  normal.     Left Ear: Hearing normal.     Nose: Nose normal.  Neck:     Thyroid: No thyroid mass or thyromegaly.     Vascular: No carotid bruit.     Trachea: Trachea normal.  Cardiovascular:     Rate and Rhythm: Normal rate and regular rhythm.     Pulses: Normal pulses.     Heart sounds: Heart sounds not distant. No murmur. No friction rub. No gallop.      Comments: No peripheral edema Pulmonary:     Effort: Pulmonary effort is normal. No respiratory distress.     Breath sounds:  Normal breath sounds.  Genitourinary:    Penis: Circumcised. Discharge and lesions present. No phimosis, paraphimosis, hypospadias, erythema, tenderness or swelling.      Scrotum/Testes: Normal.     Comments:  Several  pearly papules on penis shaft Musculoskeletal:     Lumbar back: He exhibits decreased range of motion and tenderness. He exhibits no bony tenderness.     Comments: Neg SLR , neg faber's  Skin:    General: Skin is warm and dry.     Findings: No rash.  Neurological:     Mental Status: He is alert and oriented to person, place, and time.     Sensory: Sensation is intact.     Motor: Motor function is intact.     Coordination: Coordination is intact.     Gait: Gait is intact.  Psychiatric:        Speech: Speech normal.        Behavior: Behavior normal.        Thought Content: Thought content normal.      Assessment and Plan Penile discharge, without blood Previous evaluation .. uro felt due to  Presence of larger number of glands. Will check GC/CHl given new partner. Refuses other STD testing.  Pearly penile papules Nml variant. Pt reassured.  Acute midline low back pain without sciatica Treat with heat, NSAIDS and home PT. Info given.     Kerby NoraAmy Kennedy Bohanon, MD

## 2018-11-26 NOTE — Assessment & Plan Note (Signed)
Previous evaluation .. uro felt due to  Presence of larger number of glands. Will check GC/CHl given new partner. Refuses other STD testing.

## 2018-11-26 NOTE — Assessment & Plan Note (Signed)
Nml variant. Pt reassured.

## 2018-11-26 NOTE — Assessment & Plan Note (Signed)
Treat with heat, NSAIDS and home PT. Info given.

## 2018-11-27 LAB — C. TRACHOMATIS/N. GONORRHOEAE RNA
C. trachomatis RNA, TMA: NOT DETECTED
N. gonorrhoeae RNA, TMA: NOT DETECTED

## 2018-12-13 ENCOUNTER — Telehealth: Payer: Self-pay

## 2018-12-13 NOTE — Telephone Encounter (Signed)
Pt left v/m that he is having reoccurring h/a, body pains, and very fatigued. Pt seen 11/26/18 for back pain. I spoke with pt. Pt said the back pain is bothering him still even though he saw Dr Diona Browner on 11/26/18. Due to no ins. Pt had not taken thyroid med for 3 months. Pt restarted taking thyroid med, methimazole 5 mg taking one daily 2 wks ago since got ins back. In last 2 wks cannot see any difference in how pt feels since restarting thyroid med. Pt said feels drained and cannot get enough rest or sleep. Pt has no covid symptoms except gets hot and then cold like chills but pt attributed that to his thyroid med,muscle pain from back of neck down to lower back like pt has worked out but pt has not worked out,pt has headache in forehead around to back of head usually lasting all day but sometimes H/a will come and go, H/a started months ago but worsening the last 2 months; no travel and no known exposure to + covid. Pt has closing date to buy house and move on 12/22/18 so pt said he cannot self quarantine because he has things he needs to do and will have to close on house 12/22/18. Pt request cb;  Pt said has had numerous surgeries around neck and shoulder and wonders if that in combination with weather change could be causing these symptoms or being out of thyroid med for 3 months. Pt does not want to go for covid testing because he cannot self quarantine with how busy pt is and closing on house next wk.pt wants to know what Gentry Fitz NP thinks and then request cb.

## 2018-12-13 NOTE — Telephone Encounter (Signed)
Please notify patient:  1. I recommend he continue to take his methimazole and follow up with his endocrinologist.  2. Symptoms could be from chronic neck pain, hyperthyroidism being off of medications, or Covid. 3. It's very reasonable to get Covid testing, it is free.  4. I'm happy to see him virtually for those symptoms if he doesn't test for Covid, or in the office if he does test negative for Covid.

## 2018-12-14 NOTE — Telephone Encounter (Signed)
Spoken and notified patient of Eric Shannon comments this morning. Patient verbalized understanding. Patient stated that he wants to wait and update in a week if no better.

## 2019-01-21 ENCOUNTER — Ambulatory Visit (INDEPENDENT_AMBULATORY_CARE_PROVIDER_SITE_OTHER): Payer: Self-pay | Admitting: Primary Care

## 2019-01-21 ENCOUNTER — Other Ambulatory Visit: Payer: Self-pay

## 2019-01-21 ENCOUNTER — Encounter: Payer: Self-pay | Admitting: Primary Care

## 2019-01-21 DIAGNOSIS — J019 Acute sinusitis, unspecified: Secondary | ICD-10-CM

## 2019-01-21 MED ORDER — AMOXICILLIN-POT CLAVULANATE 875-125 MG PO TABS: 1.0000 | ORAL_TABLET | Freq: Two times a day (BID) | ORAL | 0 refills | Status: DC
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
## 2019-01-21 NOTE — Patient Instructions (Signed)
Start Augmentin antibiotics for the infection Take 1 tablet by mouth twice daily for 10 days.  Continue saline and Flonase nasal sprays for congestion.  It was a pleasure to see you today! Allie Bossier, NP-C

## 2019-06-28 ENCOUNTER — Encounter: Payer: Self-pay | Admitting: Primary Care

## 2019-06-28 ENCOUNTER — Ambulatory Visit: Payer: Commercial Managed Care - PPO | Admitting: Primary Care

## 2019-06-28 ENCOUNTER — Other Ambulatory Visit: Payer: Self-pay

## 2019-06-28 VITALS — BP 120/86 | HR 88 | Temp 97.0°F | Ht 71.0 in | Wt 163.5 lb

## 2019-06-28 DIAGNOSIS — J3089 Other allergic rhinitis: Secondary | ICD-10-CM | POA: Diagnosis not present

## 2019-06-28 DIAGNOSIS — Z113 Encounter for screening for infections with a predominantly sexual mode of transmission: Secondary | ICD-10-CM | POA: Diagnosis not present

## 2019-06-28 DIAGNOSIS — R21 Rash and other nonspecific skin eruption: Secondary | ICD-10-CM

## 2019-06-28 HISTORY — DX: Rash and other nonspecific skin eruption: R21

## 2019-06-28 MED ORDER — TRIAMCINOLONE ACETONIDE 0.1 % EX CREA: 1.0000 "application " | TOPICAL_CREAM | Freq: Two times a day (BID) | CUTANEOUS | 0 refills | Status: DC

## 2019-06-28 MED ORDER — METHYLPREDNISOLONE ACETATE 80 MG/ML IJ SUSP
80.0000 mg | Freq: Once | INTRAMUSCULAR | Status: AC
  Administered 2019-06-28: 80 mg via INTRAMUSCULAR

## 2019-06-28 NOTE — Addendum Note (Signed)
Addended by: Tawnya Crook on: 06/28/2019 12:23 PM   Modules accepted: Orders

## 2019-06-28 NOTE — Assessment & Plan Note (Signed)
Suspect PND and itchy ears are secondary. Start Zyrtec HS. He will update.
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
## 2019-06-28 NOTE — Assessment & Plan Note (Signed)
Labs pending. Asymptomatic.  

## 2019-06-28 NOTE — Assessment & Plan Note (Signed)
More so dry peeling skin today.  No changes in routine, no outdoor exposure. Doesn't appear to be bedbugs.  Discussed use of daily antihistamine. IM steroids provided today. Topical triamcinolone cream provided.

## 2019-06-28 NOTE — Patient Instructions (Signed)
You may apply the triamcinolone cream twice daily for about one week.  Start an antihistamine such as Claritin, Zyrtec, Allegra for throat drainage and itchy ears. Get the off brand.  Stop by the lab prior to leaving today. I will notify you of your results once received.   It was a pleasure to see you today!

## 2019-07-02 LAB — HEPATITIS C ANTIBODY
Hepatitis C Ab: NONREACTIVE
SIGNAL TO CUT-OFF: 0.01 (ref <1.00)

## 2019-07-02 LAB — C. TRACHOMATIS/N. GONORRHOEAE RNA
C. trachomatis RNA, TMA: NOT DETECTED
N. gonorrhoeae RNA, TMA: NOT DETECTED

## 2019-07-02 LAB — HSV 1/2 AB (IGM), IFA W/RFLX TITER
HSV 1 IgM Screen: NEGATIVE
HSV 2 IgM Screen: NEGATIVE

## 2019-07-02 LAB — TRICHOMONAS VAGINALIS RNA, QL,MALES: Trichomonas vaginalis RNA: NOT DETECTED

## 2019-07-02 LAB — HSV(HERPES SIMPLEX VRS) I + II AB-IGG
HAV 1 IGG,TYPE SPECIFIC AB: 14.7 index — ABNORMAL HIGH
HSV 2 IGG,TYPE SPECIFIC AB: <0.9 index

## 2019-07-02 LAB — RPR: RPR Ser Ql: NONREACTIVE

## 2019-07-02 LAB — HIV ANTIBODY (ROUTINE TESTING W REFLEX): HIV 1&2 Ab, 4th Generation: NONREACTIVE

## 2019-07-05 ENCOUNTER — Encounter: Payer: Self-pay | Admitting: Urology

## 2019-07-05 ENCOUNTER — Ambulatory Visit: Payer: Commercial Managed Care - PPO | Admitting: Urology

## 2019-07-05 ENCOUNTER — Other Ambulatory Visit: Payer: Self-pay

## 2019-07-05 VITALS — BP 113/74 | HR 83 | Ht 71.0 in | Wt 162.0 lb

## 2019-07-05 DIAGNOSIS — N138 Other obstructive and reflux uropathy: Secondary | ICD-10-CM

## 2019-07-05 DIAGNOSIS — R310 Gross hematuria: Secondary | ICD-10-CM

## 2019-07-05 DIAGNOSIS — R8290 Unspecified abnormal findings in urine: Secondary | ICD-10-CM

## 2019-07-05 DIAGNOSIS — N401 Enlarged prostate with lower urinary tract symptoms: Secondary | ICD-10-CM

## 2019-07-05 LAB — URINALYSIS, COMPLETE
Bilirubin, UA: NEGATIVE
Glucose, UA: NEGATIVE
Ketones, UA: NEGATIVE
Leukocytes,UA: NEGATIVE
Nitrite, UA: NEGATIVE
Protein,UA: NEGATIVE
RBC, UA: NEGATIVE
Specific Gravity, UA: 1.025 (ref 1.005–1.030)
Urobilinogen, Ur: 0.2 mg/dL (ref 0.2–1.0)
pH, UA: 6 (ref 5.0–7.5)

## 2019-07-05 LAB — MICROSCOPIC EXAMINATION
Bacteria, UA: NONE SEEN
Epithelial Cells (non renal): NONE SEEN /hpf (ref 0–10)
RBC, Urine: NONE SEEN /hpf (ref 0–2)

## 2019-07-05 LAB — BLADDER SCAN AMB NON-IMAGING: Scan Result: 0
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
## 2019-07-07 LAB — CULTURE, URINE COMPREHENSIVE

## 2019-07-26 ENCOUNTER — Telehealth: Payer: Self-pay | Admitting: Primary Care

## 2019-07-26 NOTE — Telephone Encounter (Signed)
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  Pt states that he is coughing and is congested in his chest - yellow mucous.  Some sneezing, nose running/congestion - yellow and bloody at times.   Denies any changes in taste or smell  Pt has tried OTC Benadryl, Zyrtec, Dayquil/Nyquil Vapor   Pharmacy [address]  Advised that he would likely need a visit with Jae Dire to address his symptoms - patient requests something be called in to the pharmacy. Aware we will give him a call back

## 2019-07-26 NOTE — Telephone Encounter (Signed)
Per DPR, left detail message of Eric Shannon's comments for patient. 

## 2019-07-26 NOTE — Telephone Encounter (Signed)
Patient returned call,. Advised of message from [place]. He stated that he has been using the Flonase nasal spray and it has not improved. Patient stated he has also been using OTC medications which also has not helped  Patient scheduled appt for Thursday 5/20 @ 7:40 to see you

## 2019-07-26 NOTE — Telephone Encounter (Signed)
Kate, please advise

## 2019-07-26 NOTE — Telephone Encounter (Signed)
Please notify patient that he can try Flonase nasal spray for sinus congestion/pressure. If no improvement them I'm happy to evaluate him in the office.

## 2019-07-26 NOTE — Telephone Encounter (Signed)
Noted, will evaluate. 

## 2019-07-27 ENCOUNTER — Ambulatory Visit
Admission: RE | Admit: 2019-07-27 | Discharge: 2019-07-27 | Disposition: A | Discharge status: Home / Self Care | Payer: Commercial Managed Care - PPO | Source: Ambulatory Visit | Attending: Urology | Admitting: Urology

## 2019-07-27 ENCOUNTER — Other Ambulatory Visit: Payer: Self-pay

## 2019-07-27 DIAGNOSIS — R310 Gross hematuria: Secondary | ICD-10-CM | POA: Insufficient documentation

## 2019-07-27 MED ORDER — IOHEXOL 300 MG/ML  SOLN
150.0000 mL | Freq: Once | INTRAMUSCULAR | Status: AC | PRN
  Administered 2019-07-27: 150 mL via INTRAVENOUS

## 2019-07-28 ENCOUNTER — Ambulatory Visit (INDEPENDENT_AMBULATORY_CARE_PROVIDER_SITE_OTHER): Payer: Commercial Managed Care - PPO | Admitting: Primary Care

## 2019-07-28 ENCOUNTER — Encounter: Payer: Self-pay | Admitting: Primary Care

## 2019-07-28 VITALS — BP 122/82 | HR 81 | Temp 96.4°F | Ht 71.0 in | Wt 156.8 lb

## 2019-07-28 DIAGNOSIS — J019 Acute sinusitis, unspecified: Secondary | ICD-10-CM | POA: Diagnosis not present

## 2019-07-28 DIAGNOSIS — J329 Chronic sinusitis, unspecified: Secondary | ICD-10-CM | POA: Insufficient documentation

## 2019-07-28 MED ORDER — AZITHROMYCIN 250 MG PO TABS: ORAL_TABLET | ORAL | 0 refills | Status: DC

## 2019-07-28 NOTE — Patient Instructions (Signed)
Start Azithromycin antibiotics for infection. Take 2 tablets by mouth today, then 1 tablet daily for 4 additional days.  Continue to use a nasal spray and Zyrtec daily.  It was a pleasure to see you today!

## 2019-07-28 NOTE — Assessment & Plan Note (Signed)
Symptoms x 1 week, worse today. Still suspect allergy involvement, but given presentation and no improvement in symptoms after one week we will proceed with treatment for bacterial sinusitis.  Rx for Zpack sent to pharmacy.  Continue Zyrtec and Flonase.  He will update.
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
## 2019-07-29 ENCOUNTER — Ambulatory Visit: Payer: Commercial Managed Care - PPO | Admitting: Urology

## 2019-07-29 ENCOUNTER — Encounter: Payer: Self-pay | Admitting: Urology

## 2019-07-29 ENCOUNTER — Other Ambulatory Visit: Payer: Self-pay

## 2019-07-29 VITALS — BP 139/95 | HR 103 | Ht 70.0 in | Wt 160.0 lb

## 2019-07-29 DIAGNOSIS — R310 Gross hematuria: Secondary | ICD-10-CM | POA: Diagnosis not present

## 2019-07-29 NOTE — Progress Notes (Signed)
   07/29/19  CC:  Chief Complaint  Patient presents with  . Cysto    Indications: High risk hematuria  HPI: Benign hematuria evaluation 2018 with recent episode of gross hematuria.  See Shannon's note 07/05/2019  Blood pressure (!) 139/95, pulse (!) 103, height 5\' 10"  (1.778 m), weight 160 lb (72.6 kg). NED. A&Ox3.   No respiratory distress   Abd soft, NT, ND Normal phallus with bilateral descended testicles  Cystoscopy Procedure Note  Patient identification was confirmed, informed consent was obtained, and patient was prepped using Betadine solution.  Lidocaine jelly was administered per urethral meatus.     Pre-Procedure: - Inspection reveals a normal caliber urethral meatus.  Procedure: The flexible cystoscope was introduced without difficulty - No urethral strictures/lesions are present. - Mild lateral lobe enlargement prostate  - Mild elevation bladder neck - Bilateral ureteral orifices identified - Bladder mucosa  reveals no ulcers, tumors, or lesions - No bladder stones - No trabeculation  Retroflexion shows no abnormalities   Post-Procedure: - Patient tolerated the procedure well  Imaging: CT images personally reviewed  CLINICAL DATA:  Gross hematuria, enlarged prostate.  EXAM: CT ABDOMEN AND PELVIS WITHOUT AND WITH CONTRAST  TECHNIQUE: Multidetector CT imaging of the abdomen and pelvis was performed following the standard protocol before and following the bolus administration of intravenous contrast.  CONTRAST:  OMNIPAQUE IOHEXOL 300 MG/ML  SOLN  COMPARISON:  07/01/2016.  FINDINGS: Lower chest: Lung bases are clear. Heart size normal. No pericardial or pleural effusion.  Hepatobiliary: Liver and gallbladder are unremarkable. No biliary ductal dilatation.  Pancreas: Negative.  Spleen: Negative.  Adrenals/Urinary Tract: Adrenal glands and kidneys are unremarkable. No urinary stones. Midportion of the right ureter is  poorly opacified, limiting evaluation. Otherwise, no filling defects in the opacified portions of the intrarenal collecting systems, ureters or bladder.  Stomach/Bowel: Stomach, small bowel, appendix and colon are unremarkable.  Vascular/Lymphatic: Vascular structures are unremarkable. No pathologically enlarged lymph nodes.  Reproductive: Prostate is slightly enlarged.  Other: No free fluid.  Mesenteries and peritoneum are unremarkable.  Musculoskeletal: Negative.  IMPRESSION: Midportion of the right ureter is poorly opacified, limiting evaluation. Prostate is slightly enlarged. No additional findings to explain the patient's hematuria.   Electronically Signed   By: 07/03/2016 M.D.   On: 07/27/2019 09:18   Assessment/ Plan: -No significant abnormalities cystoscopy/CT -Urine cytology sent -Follow-up 07/29/2019 in 6 months   Carollee Herter, MD

## 2019-08-04 ENCOUNTER — Other Ambulatory Visit: Payer: Self-pay | Admitting: Urology

## 2019-08-04 ENCOUNTER — Telehealth: Payer: Self-pay | Admitting: Urology

## 2019-08-04 NOTE — Telephone Encounter (Signed)
Urine cytology showed mild to moderate atypical cells of uncertain significance.  Recommend repeat urine cytology 3 months

## 2019-08-05 NOTE — Telephone Encounter (Signed)
Patient notified and appointment was made for Urine cytology.

## 2019-08-09 ENCOUNTER — Telehealth: Payer: Self-pay | Admitting: *Deleted

## 2019-08-09 NOTE — Telephone Encounter (Signed)
Patient called back stating that his chest is still congested. Patient stated that he has a productive cough that is green. Patient denies fever, body aches or any other symptoms.  Patient stated that he has finished the Zpak.  Patient stated that cough and congestion seems to be worse at night.  Patient denies any SOB or difficulty breathing. Patient stated that he has had bronchitis in the past and that is what this feels like. Patient wants to know if he should be given more antibiotics  Pharmacy [address], [place]

## 2019-08-09 NOTE — Telephone Encounter (Signed)
Please notify patient that the Zpak would have effectively treated bronchitis. Most of the time bronchitis is viral so antibiotics won't do anything. If he feels worse then he needs further evaluation and a chest xray. I'm not sure if we can allow him in our office, I am happy to see him again. Otherwise he would need an urgent care visit.

## 2019-08-09 NOTE — Telephone Encounter (Signed)
Patient left a voicemail stating that he was seen last week for a sinus infection. Patient stated that he does not feel that the antibiotic is working. Patient stated that he is feeling a little wheezy and may have bronchitis. Tried to call patient back to get more information. Left message on voicemail.

## 2019-08-09 NOTE — Telephone Encounter (Signed)
Patient notified as instructed by telephone and verbalized understanding. Patient stated that he will see how he does over the next few days. Patient stated if he feels that he needs to be seen again he will call the office and seek recommendations as to where he should go in order to get an xray done. Patient was given ER precautions and he verbalized understanding. Patient stated that he feels that his symptoms got worse because of all of the cardboard and dust at work. Patient will call back if he needs any further advice.

## 2019-08-09 NOTE — Telephone Encounter (Signed)
Noted  

## 2019-10-17 ENCOUNTER — Emergency Department
Admission: EM | Admit: 2019-10-17 | Discharge: 2019-10-17 | Disposition: A | Discharge status: Home / Self Care | Payer: Commercial Managed Care - PPO | Attending: Student in an Organized Health Care Education/Training Program | Admitting: Student in an Organized Health Care Education/Training Program

## 2019-10-17 ENCOUNTER — Emergency Department: Payer: Commercial Managed Care - PPO

## 2019-10-17 ENCOUNTER — Telehealth: Payer: Self-pay

## 2019-10-17 DIAGNOSIS — R519 Headache, unspecified: Secondary | ICD-10-CM

## 2019-10-17 DIAGNOSIS — J45909 Unspecified asthma, uncomplicated: Secondary | ICD-10-CM | POA: Diagnosis not present

## 2019-10-17 DIAGNOSIS — Z7951 Long term (current) use of inhaled steroids: Secondary | ICD-10-CM | POA: Diagnosis not present

## 2019-10-17 DIAGNOSIS — E039 Hypothyroidism, unspecified: Secondary | ICD-10-CM | POA: Insufficient documentation

## 2019-10-17 DIAGNOSIS — Z87891 Personal history of nicotine dependence: Secondary | ICD-10-CM | POA: Insufficient documentation

## 2019-10-17 DIAGNOSIS — G43109 Migraine with aura, not intractable, without status migrainosus: Secondary | ICD-10-CM

## 2019-10-17 DIAGNOSIS — I10 Essential (primary) hypertension: Secondary | ICD-10-CM | POA: Diagnosis not present

## 2019-10-17 DIAGNOSIS — G43909 Migraine, unspecified, not intractable, without status migrainosus: Secondary | ICD-10-CM | POA: Diagnosis not present

## 2019-10-17 LAB — DIFFERENTIAL
Abs Immature Granulocytes: 0.04 10*3/uL (ref 0.00–0.07)
Basophils Absolute: 0.1 10*3/uL (ref 0.0–0.1)
Basophils Relative: 1 %
Eosinophils Absolute: 0.2 10*3/uL (ref 0.0–0.5)
Eosinophils Relative: 2 %
Immature Granulocytes: 1 %
Lymphocytes Relative: 19 %
Lymphs Abs: 1.3 10*3/uL (ref 0.7–4.0)
Monocytes Absolute: 0.9 10*3/uL (ref 0.1–1.0)
Monocytes Relative: 12 %
Neutro Abs: 4.7 10*3/uL (ref 1.7–7.7)
Neutrophils Relative %: 65 %

## 2019-10-17 LAB — COMPREHENSIVE METABOLIC PANEL
ALT: 16 U/L (ref 0–44)
AST: 18 U/L (ref 15–41)
Albumin: 4.8 g/dL (ref 3.5–5.0)
Alkaline Phosphatase: 72 U/L (ref 38–126)
Anion gap: 6 (ref 5–15)
BUN: 21 mg/dL — ABNORMAL HIGH (ref 6–20)
CO2: 30 mmol/L (ref 22–32)
Calcium: 9 mg/dL (ref 8.9–10.3)
Chloride: 102 mmol/L (ref 98–111)
Creatinine, Ser: 1.13 mg/dL (ref 0.61–1.24)
GFR calc Af Amer: >60 mL/min (ref >60)
GFR calc non Af Amer: >60 mL/min (ref >60)
Glucose, Bld: 112 mg/dL — ABNORMAL HIGH (ref 70–99)
Potassium: 4.3 mmol/L (ref 3.5–5.1)
Sodium: 138 mmol/L (ref 135–145)
Total Bilirubin: 0.8 mg/dL (ref 0.3–1.2)
Total Protein: 8.1 g/dL (ref 6.5–8.1)

## 2019-10-17 LAB — APTT: aPTT: 30 seconds (ref 24–36)

## 2019-10-17 LAB — CBC
HCT: 45.6 % (ref 39.0–52.0)
Hemoglobin: 15.8 g/dL (ref 13.0–17.0)
MCH: 30.7 pg (ref 26.0–34.0)
MCHC: 34.6 g/dL (ref 30.0–36.0)
MCV: 88.5 fL (ref 80.0–100.0)
Platelets: 221 10*3/uL (ref 150–400)
RBC: 5.15 MIL/uL (ref 4.22–5.81)
RDW: 13 % (ref 11.5–15.5)
WBC: 7.2 10*3/uL (ref 4.0–10.5)
nRBC: 0 % (ref 0.0–0.2)

## 2019-10-17 LAB — PROTIME-INR
INR: 1 (ref 0.8–1.2)
Prothrombin Time: 12.4 seconds (ref 11.4–15.2)

## 2019-10-17 LAB — T4, FREE: Free T4: 0.79 ng/dL (ref 0.61–1.12)

## 2019-10-17 LAB — TSH: TSH: 0.333 u[IU]/mL — ABNORMAL LOW (ref 0.350–4.500)

## 2019-10-17 LAB — GLUCOSE, CAPILLARY: Glucose-Capillary: 107 mg/dL — ABNORMAL HIGH (ref 70–99)

## 2019-10-17 MED ORDER — PROCHLORPERAZINE MALEATE 10 MG PO TABS
10.0000 mg | ORAL_TABLET | Freq: Three times a day (TID) | ORAL | 0 refills | Status: DC | PRN
Start: 2019-10-17 — End: 2020-03-28

## 2019-10-17 MED ORDER — DIPHENHYDRAMINE HCL 50 MG/ML IJ SOLN
12.5000 mg | Freq: Once | INTRAMUSCULAR | Status: AC
  Administered 2019-10-17: 12.5 mg via INTRAVENOUS
  Filled 2019-10-17: qty 1

## 2019-10-17 MED ORDER — SODIUM CHLORIDE 0.9% FLUSH: 3.0000 mL | Freq: Once | INTRAVENOUS | Status: DC

## 2019-10-17 MED ORDER — GADOBUTROL 1 MMOL/ML IV SOLN
7.5000 mL | Freq: Once | INTRAVENOUS | Status: AC | PRN
  Administered 2019-10-17: 7.5 mL via INTRAVENOUS

## 2019-10-17 MED ORDER — PROCHLORPERAZINE EDISYLATE 10 MG/2ML IJ SOLN
10.0000 mg | Freq: Once | INTRAMUSCULAR | Status: AC
  Administered 2019-10-17: 10 mg via INTRAVENOUS
  Filled 2019-10-17: qty 2

## 2019-10-17 MED ORDER — ACETAMINOPHEN 500 MG PO TABS
1000.0000 mg | ORAL_TABLET | Freq: Once | ORAL | Status: AC
  Administered 2019-10-17: 1000 mg via ORAL
  Filled 2019-10-17: qty 2
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
## 2019-10-17 NOTE — Progress Notes (Signed)
Ch arrived at room in response to Code STROKE. Pt was at CT at this time. Ch was present when neurologist came in to do questions. Ch let Pt know that Ch was there was support. When Ch asked Pt if family was here, Pt said that his mother will be coming after he is situated in a room. Ch let Pt know that he can ask mom to come. Ch will follow-up with Pt later.

## 2019-10-17 NOTE — ED Notes (Addendum)
Pt report right sided headaches x 1 month. Reports right side of face felt droopy this morning. Reports was sent to ED by PCP.  No obvious deficits noted. Equal grip and strength. Alert and oriented. Clear speech

## 2019-10-17 NOTE — Telephone Encounter (Signed)
Review tab pt is presently at [facility] ED.

## 2019-10-17 NOTE — Discharge Instructions (Signed)
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
## 2019-10-17 NOTE — ED Notes (Signed)
Pt in MRI.

## 2019-10-17 NOTE — Consult Note (Signed)
Neurology Consultation  Reason for Consult: Code stroke-right-sided numbness and headaches Referring Physician: Dr. Willy Eddy, ED provider  CC: Headache, right-sided numbness  History is obtained from: Patient, chart  HPI: Eric Shannon is a 39 y.o. male past medical history of hypertension, chronic headaches/migraines, history of craniotomy for correction of Chiari malformation in 2002, increasing frequency of headaches, presents to the emergency room for evaluation of numbness on the right face and arm which started around 7:45 AM. He said he woke up this morning around 7:30 AM completely normal at his baseline.  Got ready and got to work and around 7:45 AM noticed that his right side of the face did not feel normal.  He felt as if the right face and arm were numb.  He also said that his boss noticed that his right eye appeared swollen at that time. Given his history of Chiari malformation and prior neurosurgical intervention, he came to the emergency room for further evaluation. He reports that he has been having headaches and currently have a headache.  He reports these headaches have gotten worse over the past few months to years and he has been treating them with off-and-on Marlin Canary powders-which take them away for an hour or so but then they return again. He denies any illicit drug use or smoking. He denies any current visual symptoms but reports a mild headache involving the right head, neck and right face.  He also endorses some numbness in the right arm but no frank weakness. He denies any chest pain shortness of breath.  Denies nausea vomiting abdominal pain. Denies any sick contacts.  In the emergency room, blood pressure 148/90, map 106, afebrile.  LKW: 7:30 AM on 10/17/2019 tpa given?: no, symptoms too mild to treat, less likely stroke and more likely complex migraine Premorbid modified Rankin scale (mRS): 0  ROS: Performed and negative except as noted in HPI  Past Medical  History:  Diagnosis Date  . Asthma   . Chickenpox   . Elevated blood pressure   . Migraine     Family History  Problem Relation Age of Onset  . Hypertension Father   . Hypertension Mother   . Hyperthyroidism Mother   . Prostate cancer Maternal Grandfather   . Prostate cancer Maternal Uncle   . Prostate cancer Paternal Uncle   . Kidney cancer Neg Hx   . Bladder Cancer Neg Hx     Social History:   reports that he quit smoking about 20 years ago. He has never used smokeless tobacco. He reports current alcohol use. He reports that he does not use drugs.  Medications  Current Facility-Administered Medications:  .  acetaminophen (TYLENOL) tablet 1,000 mg, 1,000 mg, Oral, Once, Willy Eddy, MD .  diphenhydrAMINE (BENADRYL) injection 12.5 mg, 12.5 mg, Intravenous, Once, Willy Eddy, MD .  prochlorperazine (COMPAZINE) injection 10 mg, 10 mg, Intravenous, Once, Willy Eddy, MD .  sodium chloride flush (NS) 0.9 % injection 3 mL, 3 mL, Intravenous, Once, Willy Eddy, MD  Current Outpatient Medications:  .  azelastine (OPTIVAR) 0.05 % ophthalmic solution, Place 1 drop into both eyes 2 (two) times daily., Disp: 6 mL, Rfl: 12 .  azithromycin (ZITHROMAX) 250 MG tablet, Take 2 tablets by mouth today, then 1 tablet daily for 4 additional days., Disp: 6 tablet, Rfl: 0 .  fluticasone (FLONASE) 50 MCG/ACT nasal spray, Place 2 sprays into both nostrils daily., Disp: 16 g, Rfl: 6 .  methimazole (TAPAZOLE) 5 MG tablet, Take 5 mg by  mouth 3 (three) times daily., Disp: , Rfl:  .  Multiple Vitamin (MULTI-VITAMINS) TABS, Take by mouth., Disp: , Rfl:  .  triamcinolone cream (KENALOG) 0.1 %, Apply 1 application topically 2 (two) times daily., Disp: 30 g, Rfl: 0   Exam: Current vital signs: Wt 75.5 kg   BMI 23.88 kg/m  Vital signs in last 24 hours: Weight:  [75.5 kg] 75.5 kg (08/09 0937) General exam:Awake alert in no distress HEENT: Normocephalic, atraumatic, prior  craniotomy scar noted. CVs: Regular rhythm Abdomen nondistended nontender Respiratory: Clear chest to auscultation Extremities warm well perfused Neurological exam Awake alert oriented x3 Speech is not dysarthric Follows all commands Cranial nerves: Pupils equal round reactive light, extraocular movements intact, visual fields are full, there is a mild left ptosis which is baseline according to him, there is also mild flattening of the left nasolabial fold which is baseline according to him, diminished sensation on the right face in comparison to the left, tongue and palate midline, shoulder shrug intact. Motor exam: 5/5 with no drift in all fours Sensory exam: Diminished sensation mildly to light touch on the right arm in comparison to the left.  Otherwise unremarkable. Coordination: No dysmetria on finger-nose-finger testing bilaterally. NIH stroke scale 1a Level of Conscious.: 0 1b LOC Questions: 0 1c LOC Commands: 0 2 Best Gaze: 0 3 Visual: 0 4 Facial Palsy: 1 5a Motor Arm - left: 0 5b Motor Arm - Right: 0 6a Motor Leg - Left: 0 6b Motor Leg - Right: 0 7 Limb Ataxia: 0 8 Sensory: 1 9 Best Language: 0 10 Dysarthria: 0 11 Extinct. and Inatten.: 0 TOTAL: 2    Labs I have reviewed labs in epic and the results pertinent to this consultation are:  CBC    Component Value Date/Time   WBC 7.2 10/17/2019 0936   RBC 5.15 10/17/2019 0936   HGB 15.8 10/17/2019 0936   HCT 45.6 10/17/2019 0936   PLT 221 10/17/2019 0936   MCV 88.5 10/17/2019 0936   MCH 30.7 10/17/2019 0936   MCHC 34.6 10/17/2019 0936   RDW 13.0 10/17/2019 0936   LYMPHSABS 1.3 10/17/2019 0936   MONOABS 0.9 10/17/2019 0936   EOSABS 0.2 10/17/2019 0936   BASOSABS 0.1 10/17/2019 0936    CMP     Component Value Date/Time   NA 138 12/22/2017 0902   K 4.5 12/22/2017 0902   CL 101 12/22/2017 0902   CO2 32 12/22/2017 0902   GLUCOSE 92 12/22/2017 0902   BUN 20 12/22/2017 0902   CREATININE 1.08 12/22/2017 0902    CALCIUM 9.9 12/22/2017 0902   PROT 7.7 12/22/2017 0902   ALBUMIN 4.8 12/22/2017 0902   AST 24 12/22/2017 0902   ALT 15 12/22/2017 0902   ALKPHOS 93 12/22/2017 0902   BILITOT 0.6 12/22/2017 0902    Imaging I have reviewed the images obtained: CT head without contrast done stat-no acute changes.  Aspects 10.  Right frontal encephalomalacia and sequela of prior right frontal craniotomy.  Assessment:  39 year old man with a history of hypertension and prior history of craniotomy for a Chiari malformation and frequent headaches with increasing frequency of headaches for the past weeks to months presents for evaluation of sudden onset of right facial and arm diminished sensation. On examination, endorses sensory decrease on the right side along with baseline mild left ptosis and left nasolabial fold flattening. Symptoms to mild to treat with TPA. More likely complex migraine rather than stroke. Further imaging would be helpful  Impression:  Evaluate for complex migraine versus stroke History of Chiari malformation status post craniotomy  Recommendations: I would recommend obtaining an MRI brain with and without contrast to evaluate his brain further given the history of Chiari malformation and prior craniotomy. If that is negative for stroke or an acute process, he can follow-up outpatient He could receive a migraine cocktail for his current headache and sensory symptoms which might be complex migraine. I will follow up on imaging with you. I discussed the plan in detail with Dr. Roxan Hockey at the bedside.  -- Milon Dikes, MD [facility] Pager: [phone] If 7pm to 7am, please call on call as listed on AMION.   ADDENDUM MR brain completed and reviewed. IMPRESSION: No evidence of recent infarction, hemorrhage, or mass. No abnormal enhancement. Chronic findings detailed above.  If patient symptomatically better, can be discharged home with outpatient neurology  follow-up for headaches.  Plan discussed with Dr. Roxan Hockey  -- Milon Dikes, MD [facility] Pager: [phone] If 7pm to 7am, please call on call as listed on AMION.

## 2019-10-17 NOTE — ED Triage Notes (Signed)
Patient presents to the ED with migraines x 1 month, mostly on the right side.  Patient reports history of headaches and a craniotomy in 2003.  Patient states he noticed this morning that his right eye seemed bigger than normal.  Patient states, "the right side of my face doesn't feel right."  Patient states the right side of his face felt heavy after he washed his face this morning, around 7:45am.  When this RN touches patient's face, the right side of his face feels different.

## 2019-10-17 NOTE — Telephone Encounter (Signed)
noted 

## 2019-11-07 ENCOUNTER — Other Ambulatory Visit: Payer: Self-pay

## 2019-11-07 ENCOUNTER — Other Ambulatory Visit: Payer: Commercial Managed Care - PPO

## 2019-11-07 DIAGNOSIS — R310 Gross hematuria: Secondary | ICD-10-CM

## 2019-11-09 LAB — CYTOLOGY - NON PAP

## 2019-11-11 ENCOUNTER — Telehealth: Payer: Self-pay | Admitting: *Deleted

## 2019-11-11 NOTE — Telephone Encounter (Signed)
Notified patient as instructed, patient pleased. Discussed follow-up appointments, patient agrees  

## 2019-11-11 NOTE — Telephone Encounter (Signed)
-----   Message from Riki Altes, MD sent at 11/11/2019 11:07 AM EDT ----- Urine cytology showed inflammatory cells but no cells suspicious for cancer

## 2020-01-23 ENCOUNTER — Telehealth (INDEPENDENT_AMBULATORY_CARE_PROVIDER_SITE_OTHER): Payer: Commercial Managed Care - PPO | Admitting: Primary Care

## 2020-01-23 ENCOUNTER — Other Ambulatory Visit: Payer: Self-pay

## 2020-01-23 DIAGNOSIS — J019 Acute sinusitis, unspecified: Secondary | ICD-10-CM | POA: Diagnosis not present

## 2020-01-23 MED ORDER — AZITHROMYCIN 250 MG PO TABS: ORAL_TABLET | ORAL | 0 refills | Status: DC

## 2020-01-23 NOTE — Assessment & Plan Note (Signed)
Symptoms highly suggestive of allergy involvement.  He believes he now has sinusitis secondary to allergies given his experience 6 months ago. I recommended he continue his Zyrtec and Flonase. He is requesting a Z-Pak as this was beneficial last time.  We discussed that we can try this, but this may or may not alleviate symptoms.  Ultimately the best way to alleviate symptoms is to avoid the allergen.  Prescription for Z-Pak sent to pharmacy. Will update.

## 2020-01-23 NOTE — Patient Instructions (Addendum)
  Start Azithromycin antibiotics for infection. Take 2 tablets by mouth today, then 1 tablet daily for 4 additional days.  Continue to use Zyrtec and Flonase for allergies.  It was a pleasure to see you today! Mayra Reel, NP-C
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
## 2020-01-31 ENCOUNTER — Encounter: Payer: Self-pay | Admitting: Urology

## 2020-01-31 ENCOUNTER — Ambulatory Visit (INDEPENDENT_AMBULATORY_CARE_PROVIDER_SITE_OTHER): Payer: Commercial Managed Care - PPO | Admitting: Urology

## 2020-01-31 ENCOUNTER — Other Ambulatory Visit: Payer: Self-pay

## 2020-01-31 VITALS — BP 136/85 | HR 79 | Ht 70.0 in | Wt 168.0 lb

## 2020-01-31 DIAGNOSIS — R319 Hematuria, unspecified: Secondary | ICD-10-CM

## 2020-01-31 DIAGNOSIS — N138 Other obstructive and reflux uropathy: Secondary | ICD-10-CM | POA: Diagnosis not present

## 2020-01-31 DIAGNOSIS — N401 Enlarged prostate with lower urinary tract symptoms: Secondary | ICD-10-CM | POA: Diagnosis not present

## 2020-01-31 LAB — BLADDER SCAN AMB NON-IMAGING: Scan Result: 12

## 2020-02-01 LAB — PSA: Prostate Specific Ag, Serum: 1.1 ng/mL (ref 0.0–4.0)

## 2020-02-06 LAB — URINALYSIS, COMPLETE
Bilirubin, UA: NEGATIVE
Glucose, UA: NEGATIVE
Ketones, UA: NEGATIVE
Leukocytes,UA: NEGATIVE
Nitrite, UA: NEGATIVE
Protein,UA: NEGATIVE
RBC, UA: NEGATIVE
Specific Gravity, UA: 1.02 (ref 1.005–1.030)
Urobilinogen, Ur: 0.2 mg/dL (ref 0.2–1.0)
pH, UA: 6 (ref 5.0–7.5)

## 2020-02-06 LAB — MICROSCOPIC EXAMINATION
Bacteria, UA: NONE SEEN
Epithelial Cells (non renal): NONE SEEN /hpf (ref 0–10)

## 2020-03-19 DIAGNOSIS — F518 Other sleep disorders not due to a substance or known physiological condition: Secondary | ICD-10-CM

## 2020-03-19 DIAGNOSIS — G4720 Circadian rhythm sleep disorder, unspecified type: Secondary | ICD-10-CM | POA: Insufficient documentation

## 2020-03-19 HISTORY — DX: Other sleep disorders not due to a substance or known physiological condition: F51.8

## 2020-03-28 ENCOUNTER — Other Ambulatory Visit (INDEPENDENT_AMBULATORY_CARE_PROVIDER_SITE_OTHER): Payer: Commercial Managed Care - PPO

## 2020-03-28 ENCOUNTER — Encounter: Payer: Self-pay | Admitting: Primary Care

## 2020-03-28 ENCOUNTER — Telehealth: Payer: Commercial Managed Care - PPO | Admitting: Primary Care

## 2020-03-28 ENCOUNTER — Other Ambulatory Visit: Payer: Self-pay

## 2020-03-28 ENCOUNTER — Telehealth (INDEPENDENT_AMBULATORY_CARE_PROVIDER_SITE_OTHER): Payer: Commercial Managed Care - PPO | Admitting: Primary Care

## 2020-03-28 VITALS — Ht 71.0 in | Wt 172.0 lb

## 2020-03-28 DIAGNOSIS — J3489 Other specified disorders of nose and nasal sinuses: Secondary | ICD-10-CM

## 2020-03-28 DIAGNOSIS — J329 Chronic sinusitis, unspecified: Secondary | ICD-10-CM | POA: Diagnosis not present

## 2020-03-28 DIAGNOSIS — R8290 Unspecified abnormal findings in urine: Secondary | ICD-10-CM | POA: Insufficient documentation

## 2020-03-28 LAB — POC URINALSYSI DIPSTICK (AUTOMATED)
Bilirubin, UA: NEGATIVE
Blood, UA: NEGATIVE
Glucose, UA: POSITIVE — AB
Ketones, UA: NEGATIVE
Leukocytes, UA: NEGATIVE
Nitrite, UA: NEGATIVE
Protein, UA: NEGATIVE
Spec Grav, UA: <=1.005 — AB (ref 1.010–1.025)
Urobilinogen, UA: 0.2 E.U./dL
pH, UA: 6 (ref 5.0–8.0)

## 2020-03-28 NOTE — Patient Instructions (Signed)
Come by our office to provide the urine test.  You will be contacted regarding your referral to ENT.  Please let us know if you have not been contacted within two weeks.   Stop using Afrin as this will make your symptoms worse!  Nasal Congestion/Ear Pressure/Sinus Pressure: Try using Flonase (fluticasone) nasal spray. Instill 1 spray in each nostril twice daily.   It was a pleasure to see you today! Mayra Reel, NP-

## 2020-03-28 NOTE — Assessment & Plan Note (Signed)
Chronic nasal congestion, also chronic and recurrent use of Afrin, discussed to stop Afrin ASAP as this may be contributing to symptoms.  Do not suspect acute bacterial process, especially since symptoms have been chronic for the last year, never "cleared up" after antibiotic treatment in November 2021.  I offered a CT sinus to evaluate sinuses, he declines. I offered ENT evaluation for which he agrees. Stop Afrin, start Flonase. ENT referral placed.

## 2020-03-28 NOTE — Assessment & Plan Note (Signed)
  Will obtain UA with Culture for urinary symptoms. He does have chronic urinary symptoms with mildly enlarged prostate, follows with Urology. He does not appear acutely ill or in distress. Await results.
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
## 2020-03-29 LAB — URINE CULTURE
MICRO NUMBER:: 11433568
Result:: NO GROWTH
SPECIMEN QUALITY:: ADEQUATE

## 2020-03-29 NOTE — Telephone Encounter (Signed)
Will you have him come back for dirty urine for gonorrhea, chlamydia, trichomonas? My apologies, but I do not remember him requesting STD testing.  Tell him that I am sorry!

## 2020-03-30 ENCOUNTER — Other Ambulatory Visit: Payer: Self-pay

## 2020-03-30 ENCOUNTER — Other Ambulatory Visit: Payer: Commercial Managed Care - PPO

## 2020-03-30 DIAGNOSIS — Z113 Encounter for screening for infections with a predominantly sexual mode of transmission: Secondary | ICD-10-CM

## 2020-03-30 LAB — SPECIMEN STATUS REPORT

## 2020-03-30 LAB — NOVEL CORONAVIRUS, NAA: SARS-CoV-2, NAA: NOT DETECTED

## 2020-03-30 LAB — SARS-COV-2, NAA 2 DAY TAT

## 2020-03-30 NOTE — Telephone Encounter (Signed)
Called patient will come in for urine today have added to lab and placed order.

## 2020-04-02 LAB — C. TRACHOMATIS/N. GONORRHOEAE RNA
C. trachomatis RNA, TMA: NOT DETECTED
N. gonorrhoeae RNA, TMA: NOT DETECTED

## 2020-04-04 ENCOUNTER — Other Ambulatory Visit: Payer: Self-pay

## 2020-04-04 ENCOUNTER — Encounter: Payer: Self-pay | Admitting: Urology

## 2020-04-04 ENCOUNTER — Ambulatory Visit: Payer: Commercial Managed Care - PPO | Admitting: Urology

## 2020-04-04 VITALS — BP 149/84 | HR 105 | Ht 71.0 in | Wt 172.0 lb

## 2020-04-04 DIAGNOSIS — N342 Other urethritis: Secondary | ICD-10-CM

## 2020-04-04 DIAGNOSIS — N401 Enlarged prostate with lower urinary tract symptoms: Secondary | ICD-10-CM | POA: Diagnosis not present

## 2020-04-04 DIAGNOSIS — R310 Gross hematuria: Secondary | ICD-10-CM

## 2020-04-04 DIAGNOSIS — N138 Other obstructive and reflux uropathy: Secondary | ICD-10-CM

## 2020-04-04 LAB — BLADDER SCAN AMB NON-IMAGING: Scan Result: 238

## 2020-04-04 MED ORDER — AZITHROMYCIN 500 MG PO TABS
1000.0000 mg | ORAL_TABLET | Freq: Once | ORAL | 0 refills | Status: AC
Start: 2020-04-04 — End: 2020-04-04

## 2020-04-04 MED ORDER — CELECOXIB 100 MG PO CAPS: 100.0000 mg | ORAL_CAPSULE | Freq: Every day | ORAL | 0 refills | Status: DC
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
## 2020-04-04 NOTE — Patient Instructions (Signed)
Urethritis, Adult  Urethritis is swelling (inflammation) of the urethra. The urethra is the tube that drains urine from the bladder. It is important to get treatment for this condition early. Delayed treatmentmay lead to complications. What are the causes? This condition may be caused by: Germs that are spread through sexual contact. This is the leading cause of urethritis. This may include bacterial or viral infections. Injury to the urethra. Injury can happen after a thin, flexible tube (catheter) is inserted into the urethra to drain urine, or after medical instruments or foreign bodies are inserted into the area. Chemical irritation. This may include contact with spermicide. A disease that causes inflammation. This is rare. What increases the risk? The following factors may make you more likely to develop this condition: Having sex without using a condom. Having multiple sexual partners. Having poor hygiene. What are the signs or symptoms? Symptoms of this condition include: Pain with urination. Frequent urination. Urgent need to urinate. Itching and pain in the vagina or penis. Discharge coming from the penis. However, women rarely have symptoms. How is this diagnosed? This condition is diagnosed based on your medical history and symptoms as well as a physical exam. Tests may also be done. These may include: Urine tests. Swabs from the urethra. How is this treated? Treatment for this condition depends on the cause. Urethritis caused by a bacterial infection is treated with antibiotic medicine. Any sexual partnersmust also be treated. Follow these instructions at home: Medicines Take over-the-counter and prescription medicines only as told by your health care provider. If you were prescribed antibiotic medicine, take it as told by your health care provider. Do not stop taking the antibiotic even if you start to feel better. Lifestyle Avoid using perfumed soaps, bubble bath, and  shampoo when you bathe or shower. Rinse the vaginal area after bathing. Wear cotton underwear. Not wearing underwear when going to bed can help. Make sure to wipe from front to back after using the toilet if you are male. Do not have sex until your health care provider approves. When you do have sex, be sure to practice safe sex. Tell anyone with whom you have had sexual relations in the past 60 days that he or she may be at risk of infection. General instructions Drink enough fluid to keep your urine clear or pale yellow. It is up to you to get your test results. Ask your health care provider, or the department that is doing the test, when your results will be ready. Keep all follow-up visits as told by your health care provider. This is important. Get tested again 3 months after treatment to make sure the infection is gone. It is important that your sexual partner also gets tested again. Contact a health care provider if: Your symptoms have not improved after 3 days. Your symptoms get worse. You have eye redness or pain. You develop abdominal pain or pelvic pain (in females). You develop joint pain. You have a fever. Get help right away if: You have severe pain in the belly, back, or side. You vomit repeatedly. Summary Urethritis is a swelling (inflammation) of the urethra. This condition is caused by germs that are spread through sexual contact. This is the main cause of this illness. It is important to get treatment for this condition early. Delayed treatment may lead to complications. Treatment for this condition depends on the cause. Any sexual partners must also be treated. This information is not intended to replace advice given to you by   your health care provider. Make sure you discuss any questions you have with your healthcare provider. Document Revised: 02/06/2017 Document Reviewed: 04/01/2016 Elsevier Patient Education  2021 [employer]  

## 2020-04-05 LAB — URINALYSIS, COMPLETE
Bilirubin, UA: NEGATIVE
Glucose, UA: NEGATIVE
Ketones, UA: NEGATIVE
Leukocytes,UA: NEGATIVE
Nitrite, UA: NEGATIVE
Protein,UA: NEGATIVE
RBC, UA: NEGATIVE
Specific Gravity, UA: 1.015 (ref 1.005–1.030)
Urobilinogen, Ur: 0.2 mg/dL (ref 0.2–1.0)
pH, UA: 5.5 (ref 5.0–7.5)

## 2020-04-05 LAB — MICROSCOPIC EXAMINATION
Bacteria, UA: NONE SEEN
Epithelial Cells (non renal): NONE SEEN /hpf (ref 0–10)
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
## 2020-05-09 ENCOUNTER — Encounter: Payer: Self-pay | Admitting: Urology

## 2020-05-09 ENCOUNTER — Other Ambulatory Visit: Payer: Self-pay

## 2020-05-09 ENCOUNTER — Ambulatory Visit: Payer: Commercial Managed Care - PPO | Admitting: Urology

## 2020-05-09 VITALS — BP 147/108 | HR 89 | Ht 71.0 in | Wt 173.0 lb

## 2020-05-09 DIAGNOSIS — R360 Urethral discharge without blood: Secondary | ICD-10-CM | POA: Diagnosis not present

## 2020-05-09 DIAGNOSIS — N401 Enlarged prostate with lower urinary tract symptoms: Secondary | ICD-10-CM | POA: Diagnosis not present

## 2020-05-09 DIAGNOSIS — N138 Other obstructive and reflux uropathy: Secondary | ICD-10-CM | POA: Diagnosis not present

## 2020-05-09 LAB — BLADDER SCAN AMB NON-IMAGING

## 2020-05-09 MED ORDER — OXYBUTYNIN CHLORIDE 5 MG PO TABS: 5.0000 mg | ORAL_TABLET | Freq: Three times a day (TID) | ORAL | 1 refills | Status: DC
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
## 2020-06-12 ENCOUNTER — Encounter: Payer: Self-pay | Admitting: Urology

## 2020-06-12 ENCOUNTER — Ambulatory Visit: Payer: Commercial Managed Care - PPO | Admitting: Urology

## 2020-06-12 ENCOUNTER — Other Ambulatory Visit: Payer: Self-pay

## 2020-06-12 VITALS — BP 163/97 | HR 86 | Ht 71.0 in | Wt 173.0 lb

## 2020-06-12 DIAGNOSIS — N138 Other obstructive and reflux uropathy: Secondary | ICD-10-CM

## 2020-06-12 DIAGNOSIS — R360 Urethral discharge without blood: Secondary | ICD-10-CM

## 2020-06-12 DIAGNOSIS — N401 Enlarged prostate with lower urinary tract symptoms: Secondary | ICD-10-CM | POA: Diagnosis not present

## 2020-06-12 LAB — BLADDER SCAN AMB NON-IMAGING

## 2020-06-12 MED ORDER — OXYBUTYNIN CHLORIDE ER 15 MG PO TB24: 15.0000 mg | ORAL_TABLET | Freq: Every day | ORAL | 3 refills | Status: DC

## 2020-06-14 ENCOUNTER — Encounter: Payer: Self-pay | Admitting: Internal Medicine

## 2020-06-14 ENCOUNTER — Other Ambulatory Visit: Payer: Self-pay

## 2020-06-14 ENCOUNTER — Telehealth: Payer: Self-pay

## 2020-06-14 ENCOUNTER — Ambulatory Visit: Payer: Commercial Managed Care - PPO | Admitting: Internal Medicine

## 2020-06-14 VITALS — BP 120/78 | HR 79 | Temp 97.8°F | Ht 71.0 in | Wt 172.0 lb

## 2020-06-14 DIAGNOSIS — E0590 Thyrotoxicosis, unspecified without thyrotoxic crisis or storm: Secondary | ICD-10-CM | POA: Diagnosis not present

## 2020-06-14 DIAGNOSIS — R1013 Epigastric pain: Secondary | ICD-10-CM

## 2020-06-14 HISTORY — DX: Epigastric pain: R10.13

## 2020-06-14 MED ORDER — OMEPRAZOLE 20 MG PO CPDR: 20.0000 mg | DELAYED_RELEASE_CAPSULE | Freq: Every day | ORAL | 3 refills | Status: DC

## 2020-06-14 NOTE — Assessment & Plan Note (Signed)
On low dose methimazole--will check levels

## 2020-06-14 NOTE — Assessment & Plan Note (Signed)
Sudden and fairly severe Not eating well---but no clear reflux symptoms like heartburn or dysphagia Not taking celebrex now--rare BCs Will check for H pylori with stool test---before empiric Rx with PPI If not improved, will need GI evaluation

## 2020-06-14 NOTE — Addendum Note (Signed)
Addended by: Alvina Chou on: 06/14/2020 04:43 PM   Modules accepted: Orders

## 2020-06-14 NOTE — Telephone Encounter (Signed)
Eric Shannon has already scheduled appt with Dr Alphonsus Sias today at 2:15. Eric Shannon said pt having abd pain and nausea but no vomiting.

## 2020-06-14 NOTE — Addendum Note (Signed)
Addended by: Alvina Chou on: 06/14/2020 03:08 PM   Modules accepted: Orders

## 2020-06-14 NOTE — Telephone Encounter (Signed)
[facility] - Client TELEPHONE ADVICE RECORD [facility] Patient Name: Eric Shannon Gender: Male DOB: 03/05/1981 Age: 40 Y 1 M 29 D Return Phone Number: [phone] (Primary) Address: City/ State/ Zip: [place] [address] Client [facility] - Client Client Site [facility] - Day Physician Vernona Rieger - NP Contact Type Call Who Is Calling Patient / Member / Family / Caregiver Call Type Triage / Clinical Relationship To Patient Self Return Phone Number [phone] (Primary) Chief Complaint CHEST PAIN - pain, pressure, heaviness or tightness Reason for Call Symptomatic / Request for Health Information Initial Comment Caller states patient has lower sternum pain, mainly when vomiting or dry heaving, has been ongoing Additional Comment Warm tranfer from office for triage Translation No Nurse Assessment Nurse: D'Heur Ezzard Standing, RN, Adrienne Date/Time (Eastern Time): 06/14/2020 9:34:56 AM Confirm and document reason for call. If symptomatic, describe symptoms. ---Caller states patient has lower sternum pain like when one vomits or dry heaves, he has that feeling constantly. This has been going on for the past week. Does the patient have any new or worsening symptoms? ---Yes Will a triage be completed? ---Yes Related visit to physician within the last 2 weeks? ---No Does the PT have any chronic conditions? (i.e. diabetes, asthma, this includes High risk factors for pregnancy, etc.) ---Yes List chronic conditions. ---seasonal allergies, thyroid issues, Is this a behavioral health or substance abuse call? ---No Guidelines Guideline Title Affirmed Question Affirmed Notes Nurse Date/Time Lamount Cohen Time) Abdominal Pain - Upper [1] MILDMODERATE pain AND [2] constant AND [3] present > 2 hours D'Heur Ezzard Standing, RN, Hansel Starling 06/14/2020 9:38:44 AM PLEASE NOTE: All timestamps contained within this  report are represented as [place] Standard Time. CONFIDENTIALTY NOTICE: This fax transmission is intended only for the addressee. It contains information that is legally privileged, confidential or otherwise protected from use or disclosure. If you are not the intended recipient, you are strictly prohibited from reviewing, disclosing, copying using or disseminating any of this information or taking any action in reliance on or regarding this information. If you have received this fax in error, please notify us immediately by telephone so that we can arrange for its return to [place]. Phone: [phone], Toll-Free: [phone], Fax: [phone] Page: 2 of 2 Call Id: [id number] Disp. Time Lamount Cohen Time) Disposition Final User 06/14/2020 9:32:51 AM Send to Urgent Queue Gasper Sells 06/14/2020 9:49:57 AM Called On-Call Provider D'Heur Ezzard Standing, RN, Adrienne Reason: Called office for appointment/ warm transfer; appointment made for this afternoon. 06/14/2020 9:44:55 AM See HCP within 4 Hours (or PCP triage) Yes D'Heur Ezzard Standing, RN, Hansel Starling Caller Disagree/Comply Comply Caller Understands Yes PreDisposition Call Doctor Care Advice Given Per Guideline SEE HCP (OR PCP TRIAGE) WITHIN 4 HOURS: * IF OFFICE WILL BE OPEN: You need to be seen within the next 3 or 4 hours. Call your doctor (or NP/PA) now or as soon as the office opens. * IF OFFICE WILL BE CLOSED AND NO PCP (PRIMARY CARE PROVIDER) SECOND-LEVEL TRIAGE: You need to be seen within the next 3 or 4 hours. A nearby Urgent [facility]) is often a good source of care. Another choice is to go to the ED. Go sooner if you become worse. TAKE AN ANTACID: * If having pain now, try taking an antacid (e.g., Mylanta, Maalox). * Dose: Take 2 tablespoons (30 ml) of liquid by mouth. CALL BACK IF: * You become worse CARE ADVICE given per Abdominal Pain, Upper (Adult) guideline. Comments User:  Hansel Starling, D'Heur Ezzard Standing, RN Date/Time Lamount Cohen Time): 06/14/2020 9:40:33  AM Pain is moderate and constant. Described as a feeling of having to vomit. User: Hansel Starling, D'Heur Ezzard Standing, RN Date/Time Lamount Cohen Time): 06/14/2020 9:41:49 AM Caller has enlarged prostate. Referrals REFERRED TO PCP OFFICE

## 2020-06-14 NOTE — Telephone Encounter (Signed)
Okay---I will evaluate him at the visit
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
## 2020-06-15 LAB — CBC
HCT: 46 % (ref 39.0–52.0)
Hemoglobin: 15.6 g/dL (ref 13.0–17.0)
MCHC: 34 g/dL (ref 30.0–36.0)
MCV: 90.5 fl (ref 78.0–100.0)
Platelets: 231 10*3/uL (ref 150.0–400.0)
RBC: 5.08 Mil/uL (ref 4.22–5.81)
RDW: 13.6 % (ref 11.5–15.5)
WBC: 8.2 10*3/uL (ref 4.0–10.5)

## 2020-06-15 LAB — HELICOBACTER PYLORI  SPECIAL ANTIGEN
MICRO NUMBER:: 11743490
SPECIMEN QUALITY: ADEQUATE

## 2020-06-15 LAB — COMPREHENSIVE METABOLIC PANEL
ALT: 17 U/L (ref 0–53)
AST: 18 U/L (ref 0–37)
Albumin: 4.7 g/dL (ref 3.5–5.2)
Alkaline Phosphatase: 83 U/L (ref 39–117)
BUN: 14 mg/dL (ref 6–23)
CO2: 32 mEq/L (ref 19–32)
Calcium: 9.9 mg/dL (ref 8.4–10.5)
Chloride: 99 mEq/L (ref 96–112)
Creatinine, Ser: 1.09 mg/dL (ref 0.40–1.50)
GFR: 86.31 mL/min (ref >60.00)
Glucose, Bld: 93 mg/dL (ref 70–99)
Potassium: 5.1 mEq/L (ref 3.5–5.1)
Sodium: 137 mEq/L (ref 135–145)
Total Bilirubin: 0.6 mg/dL (ref 0.2–1.2)
Total Protein: 7.4 g/dL (ref 6.0–8.3)

## 2020-06-15 LAB — T4, FREE: Free T4: 0.86 ng/dL (ref 0.60–1.60)

## 2020-06-15 LAB — TSH: TSH: 0.46 u[IU]/mL (ref 0.35–4.50)

## 2020-06-15 LAB — SEDIMENTATION RATE: Sed Rate: 3 mm/hr (ref 0–15)

## 2020-07-05 ENCOUNTER — Encounter: Payer: Self-pay | Admitting: Primary Care

## 2020-07-05 ENCOUNTER — Other Ambulatory Visit: Payer: Self-pay

## 2020-07-05 ENCOUNTER — Ambulatory Visit: Payer: Commercial Managed Care - PPO | Admitting: Primary Care

## 2020-07-05 DIAGNOSIS — R1013 Epigastric pain: Secondary | ICD-10-CM

## 2020-07-05 DIAGNOSIS — E0590 Thyrotoxicosis, unspecified without thyrotoxic crisis or storm: Secondary | ICD-10-CM | POA: Diagnosis not present
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
## 2020-07-05 NOTE — Assessment & Plan Note (Signed)
Recent thyroid testing normal. Continue methimazole 5 mg TID. Follows with endocrinology

## 2020-07-05 NOTE — Assessment & Plan Note (Signed)
Resolved about one week ago, no longer taking omeprazole 20 mg.  Exam today benign. Discussed to resume omeprazole 20 mg if symptoms returned and to notify me.

## 2020-07-05 NOTE — Patient Instructions (Signed)
If your symptoms return, resume the omeprazole (Prilosec) 20 mg daily for about two weeks.  It was a pleasure to see you today!
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
## 2020-07-25 ENCOUNTER — Ambulatory Visit: Payer: Commercial Managed Care - PPO | Admitting: Urology

## 2020-07-25 ENCOUNTER — Encounter: Payer: Self-pay | Admitting: Urology

## 2020-07-25 ENCOUNTER — Other Ambulatory Visit: Payer: Self-pay

## 2020-07-25 VITALS — BP 130/88 | HR 80 | Ht 71.0 in | Wt 171.0 lb

## 2020-07-25 DIAGNOSIS — R360 Urethral discharge without blood: Secondary | ICD-10-CM | POA: Diagnosis not present

## 2020-07-25 DIAGNOSIS — N138 Other obstructive and reflux uropathy: Secondary | ICD-10-CM

## 2020-07-25 DIAGNOSIS — N401 Enlarged prostate with lower urinary tract symptoms: Secondary | ICD-10-CM | POA: Diagnosis not present

## 2020-07-25 LAB — BLADDER SCAN AMB NON-IMAGING: Scan Result: 15

## 2020-08-29 ENCOUNTER — Ambulatory Visit: Payer: Commercial Managed Care - PPO | Admitting: Primary Care

## 2020-08-29 ENCOUNTER — Other Ambulatory Visit: Payer: Self-pay

## 2020-08-29 VITALS — BP 130/70 | HR 81 | Temp 98.3°F | Ht 71.0 in | Wt 177.2 lb

## 2020-08-29 DIAGNOSIS — E0590 Thyrotoxicosis, unspecified without thyrotoxic crisis or storm: Secondary | ICD-10-CM

## 2020-08-29 DIAGNOSIS — R61 Generalized hyperhidrosis: Secondary | ICD-10-CM

## 2020-08-29 LAB — BASIC METABOLIC PANEL
BUN: 14 mg/dL (ref 6–23)
CO2: 31 mEq/L (ref 19–32)
Calcium: 9.3 mg/dL (ref 8.4–10.5)
Chloride: 102 mEq/L (ref 96–112)
Creatinine, Ser: 1.16 mg/dL (ref 0.40–1.50)
GFR: 79.98 mL/min (ref >60.00)
Glucose, Bld: 97 mg/dL (ref 70–99)
Potassium: 4.7 mEq/L (ref 3.5–5.1)
Sodium: 138 mEq/L (ref 135–145)

## 2020-08-29 LAB — CBC
HCT: 43.7 % (ref 39.0–52.0)
Hemoglobin: 15 g/dL (ref 13.0–17.0)
MCHC: 34.3 g/dL (ref 30.0–36.0)
MCV: 90.2 fl (ref 78.0–100.0)
Platelets: 199 10*3/uL (ref 150.0–400.0)
RBC: 4.85 Mil/uL (ref 4.22–5.81)
RDW: 13.2 % (ref 11.5–15.5)
WBC: 5.7 10*3/uL (ref 4.0–10.5)

## 2020-08-29 LAB — TSH: TSH: 0.24 u[IU]/mL — ABNORMAL LOW (ref 0.35–4.50)

## 2020-08-29 LAB — T4, FREE: Free T4: 0.85 ng/dL (ref 0.60–1.60)
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
## 2020-08-29 NOTE — Patient Instructions (Signed)
Stop by the lab prior to leaving today. I will notify you of your results once received.   Try the oxybutynin or an antihistamine (Zyrtec) medication to help reduce sweating.  It was a pleasure to see you today!

## 2020-08-29 NOTE — Assessment & Plan Note (Signed)
Located only to left upper extremity and left trunk, provoked with warmer weather, hot environments.  Recent thyroid labs from 2 months ago reviewed and are stable. Will repeat thyroid testing, add CBC, BMP.   Recommend he try the oxybutynin prescribed for urinary symptoms as this may help with sweating. We also discussed use of an antihistamine.
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
## 2020-10-26 ENCOUNTER — Ambulatory Visit: Payer: Commercial Managed Care - PPO | Admitting: Urology

## 2020-10-26 ENCOUNTER — Other Ambulatory Visit: Payer: Self-pay

## 2020-10-26 ENCOUNTER — Ambulatory Visit
Admission: RE | Admit: 2020-10-26 | Discharge: 2020-10-26 | Disposition: A | Discharge status: Home / Self Care | Payer: Commercial Managed Care - PPO | Source: Ambulatory Visit | Attending: Urology | Admitting: Urology

## 2020-10-26 ENCOUNTER — Encounter: Payer: Self-pay | Admitting: Urology

## 2020-10-26 VITALS — BP 176/83 | HR 94 | Ht 71.0 in | Wt 174.0 lb

## 2020-10-26 DIAGNOSIS — N50811 Right testicular pain: Secondary | ICD-10-CM | POA: Diagnosis present

## 2020-10-26 DIAGNOSIS — N50819 Testicular pain, unspecified: Secondary | ICD-10-CM

## 2020-10-26 LAB — URINALYSIS, COMPLETE
Bilirubin, UA: NEGATIVE
Ketones, UA: NEGATIVE
Leukocytes,UA: NEGATIVE
Nitrite, UA: NEGATIVE
Protein,UA: NEGATIVE
RBC, UA: NEGATIVE
Specific Gravity, UA: 1.02 (ref 1.005–1.030)
Urobilinogen, Ur: 0.2 mg/dL (ref 0.2–1.0)
pH, UA: 5.5 (ref 5.0–7.5)

## 2020-10-26 LAB — BLADDER SCAN AMB NON-IMAGING: Scan Result: 4

## 2020-10-26 LAB — MICROSCOPIC EXAMINATION
Bacteria, UA: NONE SEEN
Epithelial Cells (non renal): NONE SEEN /hpf (ref 0–10)

## 2020-10-30 LAB — CULTURE, URINE COMPREHENSIVE

## 2021-01-17 ENCOUNTER — Other Ambulatory Visit: Payer: Self-pay

## 2021-01-17 DIAGNOSIS — N401 Enlarged prostate with lower urinary tract symptoms: Secondary | ICD-10-CM

## 2021-01-17 DIAGNOSIS — N138 Other obstructive and reflux uropathy: Secondary | ICD-10-CM

## 2021-01-18 ENCOUNTER — Other Ambulatory Visit: Payer: Commercial Managed Care - PPO

## 2021-01-18 ENCOUNTER — Other Ambulatory Visit: Payer: Self-pay

## 2021-01-18 DIAGNOSIS — N138 Other obstructive and reflux uropathy: Secondary | ICD-10-CM

## 2021-01-18 DIAGNOSIS — N401 Enlarged prostate with lower urinary tract symptoms: Secondary | ICD-10-CM

## 2021-01-19 LAB — PSA: Prostate Specific Ag, Serum: 1 ng/mL (ref 0.0–4.0)
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
## 2021-01-23 ENCOUNTER — Encounter: Payer: Self-pay | Admitting: Urology

## 2021-01-23 ENCOUNTER — Other Ambulatory Visit: Payer: Self-pay

## 2021-01-23 ENCOUNTER — Ambulatory Visit (INDEPENDENT_AMBULATORY_CARE_PROVIDER_SITE_OTHER): Payer: Commercial Managed Care - PPO | Admitting: Urology

## 2021-01-23 VITALS — BP 153/101 | HR 91 | Ht 71.0 in | Wt 174.0 lb

## 2021-01-23 DIAGNOSIS — N401 Enlarged prostate with lower urinary tract symptoms: Secondary | ICD-10-CM

## 2021-01-23 DIAGNOSIS — R319 Hematuria, unspecified: Secondary | ICD-10-CM

## 2021-01-23 DIAGNOSIS — N138 Other obstructive and reflux uropathy: Secondary | ICD-10-CM

## 2021-01-23 LAB — URINALYSIS, COMPLETE
Bilirubin, UA: NEGATIVE
Glucose, UA: NEGATIVE
Ketones, UA: NEGATIVE
Leukocytes,UA: NEGATIVE
Nitrite, UA: NEGATIVE
Protein,UA: NEGATIVE
RBC, UA: NEGATIVE
Specific Gravity, UA: 1.01 (ref 1.005–1.030)
Urobilinogen, Ur: 0.2 mg/dL (ref 0.2–1.0)
pH, UA: 7 (ref 5.0–7.5)

## 2021-01-23 LAB — MICROSCOPIC EXAMINATION
Bacteria, UA: NONE SEEN
Epithelial Cells (non renal): NONE SEEN /hpf (ref 0–10)
RBC, Urine: NONE SEEN /hpf (ref 0–2)

## 2021-01-23 MED ORDER — GEMTESA 75 MG PO TABS: 75.0000 mg | ORAL_TABLET | Freq: Every day | ORAL | 0 refills | Status: DC

## 2021-07-11 ENCOUNTER — Encounter: Payer: Self-pay | Admitting: Primary Care

## 2021-07-11 ENCOUNTER — Encounter: Payer: Self-pay | Admitting: Cardiology

## 2021-07-11 ENCOUNTER — Ambulatory Visit (INDEPENDENT_AMBULATORY_CARE_PROVIDER_SITE_OTHER): Payer: Commercial Managed Care - PPO | Admitting: Primary Care

## 2021-07-11 ENCOUNTER — Ambulatory Visit (INDEPENDENT_AMBULATORY_CARE_PROVIDER_SITE_OTHER): Payer: Commercial Managed Care - PPO

## 2021-07-11 ENCOUNTER — Ambulatory Visit: Payer: Commercial Managed Care - PPO | Admitting: Cardiology

## 2021-07-11 VITALS — BP 136/80 | HR 85 | Ht 71.0 in | Wt 172.0 lb

## 2021-07-11 VITALS — BP 136/90 | HR 79 | Ht 71.0 in | Wt 172.2 lb

## 2021-07-11 DIAGNOSIS — R3912 Poor urinary stream: Secondary | ICD-10-CM

## 2021-07-11 DIAGNOSIS — E0590 Thyrotoxicosis, unspecified without thyrotoxic crisis or storm: Secondary | ICD-10-CM

## 2021-07-11 DIAGNOSIS — Z0001 Encounter for general adult medical examination with abnormal findings: Secondary | ICD-10-CM

## 2021-07-11 DIAGNOSIS — Z113 Encounter for screening for infections with a predominantly sexual mode of transmission: Secondary | ICD-10-CM | POA: Diagnosis not present

## 2021-07-11 DIAGNOSIS — N489 Disorder of penis, unspecified: Secondary | ICD-10-CM

## 2021-07-11 DIAGNOSIS — M5412 Radiculopathy, cervical region: Secondary | ICD-10-CM

## 2021-07-11 DIAGNOSIS — R002 Palpitations: Secondary | ICD-10-CM

## 2021-07-11 DIAGNOSIS — Z0000 Encounter for general adult medical examination without abnormal findings: Secondary | ICD-10-CM

## 2021-07-11 DIAGNOSIS — I10 Essential (primary) hypertension: Secondary | ICD-10-CM

## 2021-07-11 DIAGNOSIS — R0602 Shortness of breath: Secondary | ICD-10-CM | POA: Diagnosis not present

## 2021-07-11 HISTORY — DX: Disorder of penis, unspecified: N48.9

## 2021-07-11 LAB — COMPREHENSIVE METABOLIC PANEL
ALT: 15 U/L (ref 0–53)
AST: 16 U/L (ref 0–37)
Albumin: 4.8 g/dL (ref 3.5–5.2)
Alkaline Phosphatase: 77 U/L (ref 39–117)
BUN: 16 mg/dL (ref 6–23)
CO2: 30 mEq/L (ref 19–32)
Calcium: 9.4 mg/dL (ref 8.4–10.5)
Chloride: 102 mEq/L (ref 96–112)
Creatinine, Ser: 1.1 mg/dL (ref 0.40–1.50)
GFR: 84.73 mL/min (ref >60.00)
Glucose, Bld: 98 mg/dL (ref 70–99)
Potassium: 4 mEq/L (ref 3.5–5.1)
Sodium: 138 mEq/L (ref 135–145)
Total Bilirubin: 0.6 mg/dL (ref 0.2–1.2)
Total Protein: 7.7 g/dL (ref 6.0–8.3)

## 2021-07-11 LAB — CBC
HCT: 44.7 % (ref 39.0–52.0)
Hemoglobin: 14.8 g/dL (ref 13.0–17.0)
MCHC: 33.2 g/dL (ref 30.0–36.0)
MCV: 92.8 fl (ref 78.0–100.0)
Platelets: 206 10*3/uL (ref 150.0–400.0)
RBC: 4.82 Mil/uL (ref 4.22–5.81)
RDW: 13.4 % (ref 11.5–15.5)
WBC: 6.1 10*3/uL (ref 4.0–10.5)

## 2021-07-11 LAB — LIPID PANEL
Cholesterol: 168 mg/dL (ref 0–200)
HDL: 67.4 mg/dL (ref >39.00)
LDL Cholesterol: 84 mg/dL (ref 0–99)
NonHDL: 101.07
Total CHOL/HDL Ratio: 2
Triglycerides: 85 mg/dL (ref 0.0–149.0)
VLDL: 17 mg/dL (ref 0.0–40.0)

## 2021-07-11 MED ORDER — METOPROLOL SUCCINATE ER 25 MG PO TB24: 25.0000 mg | ORAL_TABLET | Freq: Every day | ORAL | 3 refills | Status: DC

## 2021-07-11 NOTE — Assessment & Plan Note (Signed)
Chronic, more pronounced recently. ? ?Differentials include hyperthyroidism, other metabolic cause, cardiac cause. ? ?Labs pending today. ?Referral placed to cardiology. ?

## 2021-07-11 NOTE — Assessment & Plan Note (Signed)
Screening labs placed today ?

## 2021-07-11 NOTE — Assessment & Plan Note (Addendum)
Following with neurosurgery through [facility]. ?Pending MRI.  ? ?Office notes reviewed from Care Everywhere through [facility] from April 2023. ?

## 2021-07-11 NOTE — Patient Instructions (Signed)
Medication Instructions:  ? ?Your physician has recommended you make the following change in your medication:  ? ? START taking Metoprolol Succinate (Toprol XL) 25 MG once a day. ? ?*If you need a refill on your cardiac medications before your next appointment, please call your pharmacy* ? ? ?Lab Work: ? ?None ordered ? ?If you have labs (blood work) drawn today and your tests are completely normal, you will receive your results only by: ?MyChart Message (if you have MyChart) OR ?A paper copy in the mail ?If you have any lab test that is abnormal or we need to change your treatment, we will call you to review the results. ? ? ?Testing/Procedures: ? ?Your physician has requested that you have an echocardiogram in 3 weeks. Echocardiography is a painless test that uses sound waves to create images of your heart. It provides your doctor with information about the size and shape of your heart and how well your heart?s chambers and valves are working. This procedure takes approximately one hour. There are no restrictions for this procedure. ? ? ?2.    Your physician has recommended that you wear a Zio XT monitor for 2 weeks. This will be mailed to your home address in 4-5 business days.  ? ?This monitor is a medical device that records the heart?s electrical activity. Doctors most often use these monitors to diagnose arrhythmias. Arrhythmias are problems with the speed or rhythm of the heartbeat. The monitor is a small device applied to your chest. You can wear one while you do your normal daily activities. While wearing this monitor if you have any symptoms to push the button and record what you felt. Once you have worn this monitor for the period of time provider prescribed (Usually 14 days), you will return the monitor device in the postage paid box. Once it is returned they will download the data collected and provide [place] with a report which the provider will then review and we will call you with those results.  Important tips: ? ?Avoid showering during the first 24 hours of wearing the monitor. ?Avoid excessive sweating to help maximize wear time. ?Do not submerge the device, no hot tubs, and no swimming pools. ?Keep any lotions or oils away from the patch. ?After 24 hours you may shower with the patch on. Take brief showers with your back facing the shower head.  ?Do not remove patch once it has been placed because that will interrupt data and decrease adhesive wear time. ?Push the button when you have any symptoms and write down what you were feeling. ?Once you have completed wearing your monitor, remove and place into box which has postage paid and place in your outgoing mailbox.  ?If for some reason you have misplaced your box then call our office and we can provide another box and/or mail it off for you. ? ? ? ? ? ?Follow-Up: ?At [facility], you and your health needs are our priority.  As part of our continuing mission to provide you with exceptional heart care, we have created designated Provider Care Teams.  These Care Teams include your primary Cardiologist (physician) and Advanced Practice Providers (APPs -  Physician Assistants and Nurse Practitioners) who all work together to provide you with the care you need, when you need it. ? ?We recommend signing up for the patient portal called "MyChart".  Sign up information is provided on this After Visit Summary.  MyChart is used to connect with patients for Virtual Visits (Telemedicine).  Patients are able to view lab/test results, encounter notes, upcoming appointments, etc.  Non-urgent messages can be sent to your provider as well.   ?To learn more about what you can do with MyChart, go to [email or web address].   ? ?Your next appointment:   ?6-8 week(s) ? ?The format for your next appointment:   ?In Person ? ?Provider:   ?Debbe Odea, MD  ? ? ?Other Instructions ? ? ?Important Information About Sugar ? ? ? ? ? ? ?

## 2021-07-11 NOTE — Assessment & Plan Note (Signed)
Tetanus up-to-date. ?PSA reviewed from November 2022. ? ?Discussed the importance of a healthy diet and regular exercise in order for weight loss, and to reduce the risk of further co-morbidity. ? ?Exam today stable ?Labs pending and also reviewed from care everywhere. ?

## 2021-07-11 NOTE — Assessment & Plan Note (Signed)
Above goal today, also on several prior visits.  Discussed this with patient today. ? ?He believes that his elevated pressures are secondary to stress.  I did review his blood pressure reading from April 2023 which was lower. ? ?I recommended he start monitoring his blood pressure at home and report if readings are consistently at or above 135/90. ?

## 2021-07-11 NOTE — Patient Instructions (Addendum)
Stop by the lab prior to leaving today. I will notify you of your results once received.  ? ?Monitor your blood pressure.  It should run less than 135 on top and less than 90 on bottom consistently. ? ?It was a pleasure to see you today! ? ?Preventive Care 14-39 Years Old, Male ?Preventive care refers to lifestyle choices and visits with your health care provider that can promote health and wellness. Preventive care visits are also called wellness exams. ?What can I expect for my preventive care visit? ?Counseling ?During your preventive care visit, your health care provider may ask about your: ?Medical history, including: ?Past medical problems. ?Family medical history. ?Current health, including: ?Emotional well-being. ?Home life and relationship well-being. ?Sexual activity. ?Lifestyle, including: ?Alcohol, nicotine or tobacco, and drug use. ?Access to firearms. ?Diet, exercise, and sleep habits. ?Safety issues such as seatbelt and bike helmet use. ?Sunscreen use. ?Work and work Astronomer. ?Physical exam ?Your health care provider may check your: ?Height and weight. These may be used to calculate your BMI (body mass index). BMI is a measurement that tells if you are at a healthy weight. ?Waist circumference. This measures the distance around your waistline. This measurement also tells if you are at a healthy weight and may help predict your risk of certain diseases, such as type 2 diabetes and high blood pressure. ?Heart rate and blood pressure. ?Body temperature. ? ?Vaccines are usually given at various ages, according to a schedule. Your health care provider will recommend vaccines for you based on your age, medical history, and lifestyle or other factors, such as travel or where you work. ?What tests do I need? ?Screening ?Your health care provider may recommend screening tests for certain conditions. This may include: ?Lipid and cholesterol levels. ?Diabetes screening. This is done by checking your blood  sugar (glucose) after you have not eaten for a while (fasting). ?Hepatitis B test. ?Hepatitis C test. ?HIV (human immunodeficiency virus) test. ?STI (sexually transmitted infection) testing, if you are at risk. ?Talk with your health care provider about your test results, treatment options, and if necessary, the need for more tests. ?Follow these instructions at home: ?Eating and drinking ? ?Eat a healthy diet that includes fresh fruits and vegetables, whole grains, lean protein, and low-fat dairy products. ?Drink enough fluid to keep your urine pale yellow. ?Take vitamin and mineral supplements as recommended by your health care provider. ?Do not drink alcohol if your health care provider tells you not to drink. ?If you drink alcohol: ?Limit how much you have to 0-2 drinks a day. ?Know how much alcohol is in your drink. In the [place] one drink equals one 12 oz bottle of beer (355 mL), one 5 oz glass of wine (148 mL), or one 1? oz glass of hard liquor (44 mL). ?Lifestyle ?Brush your teeth every morning and night with fluoride toothpaste. Floss one time each day. ?Exercise for at least 30 minutes 5 or more days each week. ?Do not use any products that contain nicotine or tobacco. These products include cigarettes, chewing tobacco, and vaping devices, such as e-cigarettes. If you need help quitting, ask your health care provider. ?Do not use drugs. ?If you are sexually active, practice safe sex. Use a condom or other form of protection to prevent STIs. ?Find healthy ways to manage stress, such as: ?Meditation, yoga, or listening to music. ?Journaling. ?Talking to a trusted person. ?Spending time with friends and family. ?Minimize exposure to UV radiation to reduce your risk of skin  cancer. ?Safety ?Always wear your seat belt while driving or riding in a vehicle. ?Do not drive: ?If you have been drinking alcohol. Do not ride with someone who has been drinking. ?If you have been using any mind-altering substances or  drugs. ?While texting. ?When you are tired or distracted. ?Wear a helmet and other protective equipment during sports activities. ?If you have firearms in your house, make sure you follow all gun safety procedures. ?Seek help if you have been physically or sexually abused. ?What's next? ?Go to your health care provider once a year for an annual wellness visit. ?Ask your health care provider how often you should have your eyes and teeth checked. ?Stay up to date on all vaccines. ?This information is not intended to replace advice given to you by your health care provider. Make sure you discuss any questions you have with your health care provider. ?Document Revised: 08/22/2020 Document Reviewed: 08/22/2020 ?Elsevier Patient Education ? 2023 Elsevier Inc. ? ?
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
## 2021-07-11 NOTE — Assessment & Plan Note (Signed)
Intermittent, following with urology. ?No concerns today. ?

## 2021-07-11 NOTE — Assessment & Plan Note (Signed)
None noted on exam today. ?We will screen for STDs. ?
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
## 2021-07-11 NOTE — Assessment & Plan Note (Signed)
Following with Dr. Tedd Sias through [facility]. ? ?Continue methimazole 10 mg daily. ?Labs reviewed from February 2023 through [facility]. ?

## 2021-07-17 DIAGNOSIS — R002 Palpitations: Secondary | ICD-10-CM

## 2021-07-18 LAB — C. TRACHOMATIS/N. GONORRHOEAE RNA
C. trachomatis RNA, TMA: NOT DETECTED
N. gonorrhoeae RNA, TMA: NOT DETECTED

## 2021-07-18 LAB — HSV 1/2 AB (IGM), IFA W/RFLX TITER
HSV 1 IgM Screen: NEGATIVE
HSV 2 IgM Screen: NEGATIVE

## 2021-07-18 LAB — HEPATITIS C ANTIBODY
Hepatitis C Ab: NONREACTIVE
SIGNAL TO CUT-OFF: 0.07 (ref <1.00)

## 2021-07-18 LAB — RPR: RPR Ser Ql: NONREACTIVE

## 2021-07-18 LAB — HIV ANTIBODY (ROUTINE TESTING W REFLEX): HIV 1&2 Ab, 4th Generation: NONREACTIVE

## 2021-07-18 LAB — HSV(HERPES SIMPLEX VRS) I + II AB-IGG
HAV 1 IGG,TYPE SPECIFIC AB: 19.5 index — ABNORMAL HIGH
HSV 2 IGG,TYPE SPECIFIC AB: <0.9 index

## 2021-07-18 LAB — TRICHOMONAS VAGINALIS, PROBE AMP: Trichomonas vaginalis RNA: NOT DETECTED

## 2021-07-22 ENCOUNTER — Telehealth: Payer: Self-pay | Admitting: Cardiology

## 2021-07-22 NOTE — Telephone Encounter (Signed)
Called patient and left a VM requesting that he call back to let me know how many days he actually wore the monitor. It was ordered to be mailed to his home address on 07/11/21. I also let him know that we have a minimum wear time of 3 days. If it were at least 72 hours of wear time, to go ahead and mail the monitor back to [facility]. ?

## 2021-07-22 NOTE — Telephone Encounter (Signed)
Pt says that his heart monitor came off on Friday and needs to be advised as to what to do.  ? ?He states that he is working now, so if it goes to vm to leave message.  ?

## 2021-07-26 ENCOUNTER — Encounter: Payer: Self-pay | Admitting: Cardiology

## 2021-07-26 NOTE — Telephone Encounter (Signed)
Called and left patient a message informing him that I can have another monitor sent to him free of charge so we can try again as 2 days is not enough wear time. I requested for him to call back or MyChart message me to let me know if this is okay with him, and then I will get the monitor sent out to him.

## 2021-07-26 NOTE — Telephone Encounter (Signed)
Pt calling regarding monitor. Pt states that he put monitor on 5/10 and it fell off on 5/12. He stated that he tried to put it back on but now it is flashing orange. He would like for nurse to callback. Please advise

## 2021-07-29 NOTE — Telephone Encounter (Signed)
Patient sent me a MyChart stating that he would like another monitor. Zio was sent to him over night per Neeral at [facility] email to me.

## 2021-08-06 ENCOUNTER — Ambulatory Visit (INDEPENDENT_AMBULATORY_CARE_PROVIDER_SITE_OTHER): Payer: Commercial Managed Care - PPO

## 2021-08-06 DIAGNOSIS — R0602 Shortness of breath: Secondary | ICD-10-CM

## 2021-08-09 LAB — ECHOCARDIOGRAM COMPLETE
AR max vel: 2.47 cm2
AV Area VTI: 2.53 cm2
AV Area mean vel: 2.47 cm2
AV Mean grad: 3 mmHg
AV Peak grad: 4.8 mmHg
Ao pk vel: 1.1 m/s
Area-P 1/2: 3 cm2
Calc EF: 49 %
S' Lateral: 3.3 cm
Single Plane A2C EF: 48.6 %
Single Plane A4C EF: 49.3 %

## 2021-08-15 ENCOUNTER — Telehealth: Payer: Self-pay | Admitting: Primary Care

## 2021-08-15 NOTE — Telephone Encounter (Signed)
Pt called because he had got an MRI done with [facility] on May 9th 2023 and he thought he had to get in contact with them to get results but they told him to contact his PCP for results. He asked for a call back at [phone]

## 2021-08-15 NOTE — Telephone Encounter (Signed)
Patient returned call. Advised message below, patient states that Anda Kraft was the one who ordered the MRI and is inquiring about the results.

## 2021-08-15 NOTE — Telephone Encounter (Signed)
Left message to return call to our office.  Need to make sure patient is reaching out to provider who ordered the MRI.

## 2021-08-16 NOTE — Telephone Encounter (Signed)
Called patient let him know Jae Dire did not order that test. He said that he thinks he just called for it. Assured him that a doctor has to order that test is can not be done without. Reviewed his care everywhere let him know it was Doctor with neurosurgery gave patient contact number he will call them for results. No further action needed by your office.

## 2021-08-19 ENCOUNTER — Telehealth: Payer: Self-pay | Admitting: Cardiology

## 2021-08-19 NOTE — Telephone Encounter (Signed)
patient is requesting to speak with Dr. Sandie Ano or nurse in regards to his echo test and heart monitor

## 2021-08-22 NOTE — Telephone Encounter (Signed)
Spoke with patient and he asked that since his echocardiogram was normal, do we still recommend that he wear the Zio monitor that we mailed to him. I explained to him that they are two different tests and the monitor is to evaluate his palpitations, and cardiac rhythm. Patient stated that he will try and wear it for at least 3 days as the first one fell off after 2 days. He will mail it back after wearing. We cancelled patients follow up and agreed that after we get his Zio results back we can determine if he needs the follow up appointment at that time.

## 2021-09-06 ENCOUNTER — Ambulatory Visit: Payer: Commercial Managed Care - PPO | Admitting: Cardiology

## 2021-09-29 ENCOUNTER — Encounter (INDEPENDENT_AMBULATORY_CARE_PROVIDER_SITE_OTHER): Payer: Commercial Managed Care - PPO

## 2021-09-29 DIAGNOSIS — R21 Rash and other nonspecific skin eruption: Secondary | ICD-10-CM | POA: Diagnosis not present

## 2021-10-04 NOTE — Telephone Encounter (Signed)
Please see the MyChart message reply(ies) for my assessment and plan.  The patient gave consent for this Medical Advice Message and is aware that it may result in a bill to their insurance company as well as the possibility that this may result in a co-payment or deductible. They are an established patient, but are not seeking medical advice exclusively about a problem treated during an in person or video visit in the last 7 days. I did not recommend an in person or video visit within 7 days of my reply.  I spent a total of 15 minutes cumulative time within 7 days through MyChart messaging Marisal Swarey K Khaleb Broz, NP  

## 2021-10-07 ENCOUNTER — Encounter: Payer: Self-pay | Admitting: *Deleted

## 2021-10-10 ENCOUNTER — Encounter: Payer: Self-pay | Admitting: Cardiology

## 2021-11-05 ENCOUNTER — Encounter: Payer: Self-pay | Admitting: Family Medicine

## 2021-11-05 ENCOUNTER — Ambulatory Visit: Payer: Commercial Managed Care - PPO | Admitting: Family Medicine

## 2021-11-05 VITALS — BP 130/76 | HR 82 | Temp 98.7°F | Ht 71.0 in | Wt 173.4 lb

## 2021-11-05 DIAGNOSIS — R369 Urethral discharge, unspecified: Secondary | ICD-10-CM

## 2021-11-05 NOTE — Assessment & Plan Note (Signed)
Acute. In the past the patient has seen urologist for increased penile discharge with negative STD testing.  He was told that he just has a higher amount of penile discharge than average.   We will rule out gonorrhea and chlamydia.  I offered screening for additional STDs that would NOT be causing penile discharge and patient declined given he had had other STD testing done in May 2023 

## 2021-11-05 NOTE — Assessment & Plan Note (Signed)
Acute. In the past the patient has seen urologist for increased penile discharge with negative STD testing.  He was told that he just has a higher amount of penile discharge than average.   We will rule out gonorrhea and chlamydia.  I offered screening for additional STDs that would NOT be causing penile discharge and patient declined given he had had other STD testing done in May 2023

## 2021-11-05 NOTE — Progress Notes (Signed)
Patient ID: Eric Shannon, male    DOB: 04-26-1980, 41 y.o.   MRN: [id number]  This visit was conducted in person.  BP 130/76   Pulse 82   Temp 98.7 F (37.1 C) (Oral)   Ht 5\' 11"  (1.803 m)   Wt 173 lb 6 oz (78.6 kg)   SpO2 97%   BMI 24.18 kg/m    CC:  Chief Complaint  Patient presents with   STD Screening   Penile Discharge    Subjective:   HPI: Eric Shannon is a 41 y.o. male  patient of 24 presenting on 11/05/2021 for STD Screening and Penile Discharge   In last week intermittent ,  white discharge to clear white.  Noted first thing in AM.. No dysuria, slight itching. No blood inurine.   No penile lesion, no testicular pain.    Only one sexually partner, not new. Partner denied symptoms.   No fever,  no flank pain.    Relevant past medical, surgical, family and social history reviewed and updated as indicated. Interim medical history since our last visit reviewed. Allergies and medications reviewed and updated. Outpatient Medications Prior to Visit  Medication Sig Dispense Refill   methimazole (TAPAZOLE) 5 MG tablet Take 5 mg by mouth 3 (three) times daily.     metoprolol succinate (TOPROL XL) 25 MG 24 hr tablet Take 1 tablet (25 mg total) by mouth daily. 30 tablet 3   Multiple Vitamin (MULTI-VITAMINS) TABS Take by mouth.     No facility-administered medications prior to visit.     Per HPI unless specifically indicated in ROS section below Review of Systems  Constitutional:  Negative for fatigue and fever.  HENT:  Negative for ear pain.   Eyes:  Negative for pain.  Respiratory:  Negative for cough and shortness of breath.   Cardiovascular:  Negative for chest pain, palpitations and leg swelling.  Gastrointestinal:  Negative for abdominal pain.  Genitourinary:  Negative for dysuria.  Musculoskeletal:  Negative for arthralgias.  Neurological:  Negative for syncope, light-headedness and headaches.  Psychiatric/Behavioral:  Negative for  dysphoric mood.    Objective:  BP 130/76   Pulse 82   Temp 98.7 F (37.1 C) (Oral)   Ht 5\' 11"  (1.803 m)   Wt 173 lb 6 oz (78.6 kg)   SpO2 97%   BMI 24.18 kg/m   Wt Readings from Last 3 Encounters:  11/05/21 173 lb 6 oz (78.6 kg)  07/11/21 172 lb (78 kg)  07/11/21 172 lb 3.2 oz (78.1 kg)      Physical Exam Constitutional:      Appearance: He is well-developed.  HENT:     Head: Normocephalic.     Right Ear: Hearing normal.     Left Ear: Hearing normal.     Nose: Nose normal.  Neck:     Thyroid: No thyroid mass or thyromegaly.     Vascular: No carotid bruit.     Trachea: Trachea normal.  Cardiovascular:     Rate and Rhythm: Normal rate and regular rhythm.     Pulses: Normal pulses.     Heart sounds: Heart sounds not distant. No murmur heard.    No friction rub. No gallop.     Comments: No peripheral edema Pulmonary:     Effort: Pulmonary effort is normal. No respiratory distress.     Breath sounds: Normal breath sounds.  Skin:    General: Skin is warm and dry.  Findings: No rash.  Psychiatric:        Speech: Speech normal.        Behavior: Behavior normal.        Thought Content: Thought content normal.       Results for orders placed or performed in visit on 08/06/21  ECHOCARDIOGRAM COMPLETE  Result Value Ref Range   AR max vel 2.47 cm2   AV Peak grad 4.8 mmHg   Ao pk vel 1.10 m/s   S' Lateral 3.30 cm   Area-P 1/2 3.00 cm2   AV Area VTI 2.53 cm2   AV Mean grad 3.0 mmHg   Single Plane A4C EF 49.3 %   Single Plane A2C EF 48.6 %   Calc EF 49.0 %   AV Area mean vel 2.47 cm2     COVID 19 screen:  No recent travel or known exposure to COVID19 The patient denies respiratory symptoms of COVID 19 at this time. The importance of social distancing was discussed today.   Assessment and Plan    Problem List Items Addressed This Visit     Penile discharge - Primary    Acute. In the past the patient has seen urologist for increased penile discharge with  negative STD testing.  He was told that he just has a higher amount of penile discharge than average.   We will rule out gonorrhea and chlamydia.  I offered screening for additional STDs that would NOT be causing penile discharge and patient declined given he had had other STD testing done in May 2023      Relevant Orders   C. trachomatis/N. gonorrhoeae RNA      Kerby Nora, MD

## 2021-11-06 LAB — C. TRACHOMATIS/N. GONORRHOEAE RNA
C. trachomatis RNA, TMA: NOT DETECTED
N. gonorrhoeae RNA, TMA: NOT DETECTED

## 2021-12-10 ENCOUNTER — Encounter: Payer: Self-pay | Admitting: Family Medicine

## 2021-12-10 ENCOUNTER — Ambulatory Visit: Payer: Commercial Managed Care - PPO | Admitting: Family Medicine

## 2021-12-10 VITALS — BP 130/90 | HR 79 | Temp 98.6°F | Ht 71.0 in | Wt 176.2 lb

## 2021-12-10 DIAGNOSIS — R21 Rash and other nonspecific skin eruption: Secondary | ICD-10-CM

## 2021-12-10 DIAGNOSIS — R051 Acute cough: Secondary | ICD-10-CM | POA: Insufficient documentation

## 2021-12-10 LAB — POC COVID19 BINAXNOW: SARS Coronavirus 2 Ag: NEGATIVE

## 2021-12-10 MED ORDER — AZITHROMYCIN 250 MG PO TABS: ORAL_TABLET | ORAL | 0 refills | Status: DC

## 2021-12-10 MED ORDER — TRIAMCINOLONE ACETONIDE 0.1 % EX CREA: 1.0000 | TOPICAL_CREAM | Freq: Two times a day (BID) | CUTANEOUS | 0 refills | Status: DC

## 2021-12-10 MED ORDER — ALBUTEROL SULFATE HFA 108 (90 BASE) MCG/ACT IN AERS: 2.0000 | INHALATION_SPRAY | Freq: Four times a day (QID) | RESPIRATORY_TRACT | 2 refills | Status: DC | PRN

## 2021-12-10 MED ORDER — PREDNISONE 10 MG PO TABS: ORAL_TABLET | ORAL | 0 refills | Status: DC

## 2021-12-10 NOTE — Addendum Note (Signed)
Addended by: Carter Kitten on: 12/10/2021 03:12 PM   Modules accepted: Orders

## 2021-12-10 NOTE — Progress Notes (Signed)
Patient ID: Eric Shannon, male    DOB: 07/24/1980, 41 y.o.   MRN: [id number]  This visit was conducted in person.  BP (!) 130/90   Pulse 79   Temp 98.6 F (37 C) (Oral)   Ht 5\' 11"  (1.803 m)   Wt 176 lb 4 oz (79.9 kg)   SpO2 98%   BMI 24.58 kg/m    CC:  Chief Complaint  Patient presents with   Cough   Groin Swelling    Subjective:   HPI: Eric Shannon is a 41 y.o. male  patient of 24 with history of  HTN, presenting on 12/10/2021 for Cough and Groin Swelling  He reports  5 days ago.. new onset cough. Occ productive of yellow mucus.  No nasal congestion,  no ear pain, no ST, mild itching in throat.  Occ heart racing ( has had this issue in past)  Chest pain with coughing.  No fever, no chills  No sinus pain.  Multiple people at work.. sick, nothing specific.   Benadryl.  No COVID testing.   Former smoker.. history of  reaction asthma with infection.     He has intermittent rash on penis. Has pictures appearing like ulcers/ dry flaky skin on shaft of penis, itchy not painful.  Had negative STD panel several times.02/09/2022 HSV 1 IgG positive in apst.   No history of eczema, no known exposures  Has not treated with anything.    Relevant past medical, surgical, family and social history reviewed and updated as indicated. Interim medical history since our last visit reviewed. Allergies and medications reviewed and updated. Outpatient Medications Prior to Visit  Medication Sig Dispense Refill   methimazole (TAPAZOLE) 5 MG tablet Take 5 mg by mouth 3 (three) times daily.     metoprolol succinate (TOPROL XL) 25 MG 24 hr tablet Take 1 tablet (25 mg total) by mouth daily. 30 tablet 3   Multiple Vitamin (MULTI-VITAMINS) TABS Take by mouth.     No facility-administered medications prior to visit.     Per HPI unless specifically indicated in ROS section below Review of Systems  Constitutional:  Negative for fatigue and fever.  HENT:  Negative for ear pain.    Eyes:  Negative for pain.  Respiratory:  Negative for cough and shortness of breath.   Cardiovascular:  Negative for chest pain, palpitations and leg swelling.  Gastrointestinal:  Negative for abdominal pain.  Genitourinary:  Negative for dysuria.  Musculoskeletal:  Negative for arthralgias.  Neurological:  Negative for syncope, light-headedness and headaches.  Psychiatric/Behavioral:  Negative for dysphoric mood.    Objective:  BP (!) 130/90   Pulse 79   Temp 98.6 F (37 C) (Oral)   Ht 5\' 11"  (1.803 m)   Wt 176 lb 4 oz (79.9 kg)   SpO2 98%   BMI 24.58 kg/m   Wt Readings from Last 3 Encounters:  12/10/21 176 lb 4 oz (79.9 kg)  11/05/21 173 lb 6 oz (78.6 kg)  07/11/21 172 lb (78 kg)      Physical Exam Constitutional:      Appearance: He is well-developed.  HENT:     Head: Normocephalic.     Right Ear: Hearing normal.     Left Ear: Hearing normal.     Nose: Nose normal.  Neck:     Thyroid: No thyroid mass or thyromegaly.     Vascular: No carotid bruit.     Trachea: Trachea normal.  Cardiovascular:  Rate and Rhythm: Normal rate and regular rhythm.     Pulses: Normal pulses.     Heart sounds: Heart sounds not distant. No murmur heard.    No friction rub. No gallop.     Comments: No peripheral edema Pulmonary:     Effort: Pulmonary effort is normal. No respiratory distress.     Breath sounds: Normal breath sounds.  Genitourinary:    Pubic Area: No rash.      Penis: Circumcised. Swelling present. No lesions.      Comments: Slight swelling slightly erythematous areas on either side of the penile shaft below the No ulcers, no vesicles Skin:    General: Skin is warm and dry.     Findings: No rash.  Psychiatric:        Speech: Speech normal.        Behavior: Behavior normal.        Thought Content: Thought content normal.       Results for orders placed or performed in visit on 11/05/21  C. trachomatis/N. gonorrhoeae RNA   Specimen: Urine  Result Value Ref  Range   C. trachomatis RNA, TMA NOT DETECTED NOT DETECTED   N. gonorrhoeae RNA, TMA NOT DETECTED NOT DETECTED     COVID 19 screen:  No recent travel or known exposure to COVID19 The patient denies respiratory symptoms of COVID 19 at this time. The importance of social distancing was discussed today.   Assessment and Plan Problem List Items Addressed This Visit     Acute cough    Acute COVID test in office negative Most consistent with reactive airways to viral versus bacterial infection. Treat with prednisone taper and albuterol as needed.  If not improving in the next few days she can take a prescription for antibiotic. Return and ER precautions given      Penile rash - Primary    Chronic, recurrent, intermittent All STD testing in the past has been negative except for HSV type I. Appearance of lesions today is not consistent with HSV type I but is instead more swelling and looks like a reaction to clothing or other irritant.  Most like contact dermatitis. We will be treating with a course of prednisone for his respiratory issue so his symptoms may improve from this.  I also provided him with topical triamcinolone cream to use topically.  If symptoms continue to be recurrent and not improving with this treatment, he can return in the stage when it is more like an ulcer for HSV culture.      Meds ordered this encounter  Medications   azithromycin (ZITHROMAX) 250 MG tablet    Sig: 2 tab po x 1 day then 1 tab po daily    Dispense:  6 tablet    Refill:  0   predniSONE (DELTASONE) 10 MG tablet    Sig: 3 tabs by mouth daily x 3 days, then 2 tabs by mouth daily x 2 days then 1 tab by mouth daily x 2 days    Dispense:  15 tablet    Refill:  0   albuterol (VENTOLIN HFA) 108 (90 Base) MCG/ACT inhaler    Sig: Inhale 2 puffs into the lungs every 6 (six) hours as needed for wheezing or shortness of breath.    Dispense:  8 g    Refill:  2   triamcinolone cream (KENALOG) 0.1 %    Sig:  Apply 1 Application topically 2 (two) times daily.    Dispense:  30  g    Refill:  0       Eliezer Lofts, MD

## 2021-12-10 NOTE — Assessment & Plan Note (Signed)
Acute COVID test in office negative Most consistent with reactive airways to viral versus bacterial infection. Treat with prednisone taper and albuterol as needed.  If not improving in the next few days she can take a prescription for antibiotic. Return and ER precautions given

## 2021-12-10 NOTE — Assessment & Plan Note (Signed)
Chronic, recurrent, intermittent All STD testing in the past has been negative except for HSV type I. Appearance of lesions today is not consistent with HSV type I but is instead more swelling and looks like a reaction to clothing or other irritant.  Most like contact dermatitis. We will be treating with a course of prednisone for his respiratory issue so his symptoms may improve from this.  I also provided him with topical triamcinolone cream to use topically.  If symptoms continue to be recurrent and not improving with this treatment, he can return in the stage when it is more like an ulcer for HSV culture.

## 2022-01-22 ENCOUNTER — Other Ambulatory Visit: Payer: Commercial Managed Care - PPO

## 2022-01-22 DIAGNOSIS — R319 Hematuria, unspecified: Secondary | ICD-10-CM

## 2022-01-22 DIAGNOSIS — N138 Other obstructive and reflux uropathy: Secondary | ICD-10-CM

## 2022-01-22 LAB — URINALYSIS, COMPLETE
Appearance Ur: NEGATIVE
Bilirubin, UA: NEGATIVE
Color, UA: NEGATIVE
Glucose, UA: NEGATIVE
Ketones, UA: NEGATIVE
Leukocytes,UA: NEGATIVE
Nitrite, UA: NEGATIVE
Protein,UA: NEGATIVE
RBC, UA: NEGATIVE
Specific Gravity, UA: 1.02 (ref 1.005–1.030)
Urobilinogen, Ur: 0.2 mg/dL (ref 0.2–1.0)
pH, UA: 6 (ref 5.0–7.5)

## 2022-01-22 LAB — MICROSCOPIC EXAMINATION
Bacteria, UA: NONE SEEN
RBC, Urine: NONE SEEN /hpf (ref 0–2)

## 2022-01-23 LAB — PSA: Prostate Specific Ag, Serum: 1.1 ng/mL (ref 0.0–4.0)

## 2022-01-24 ENCOUNTER — Other Ambulatory Visit: Payer: Commercial Managed Care - PPO
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
## 2022-01-28 ENCOUNTER — Ambulatory Visit: Payer: Commercial Managed Care - PPO | Admitting: Urology

## 2022-01-28 ENCOUNTER — Encounter: Payer: Self-pay | Admitting: Urology

## 2022-01-28 VITALS — BP 164/97 | HR 98 | Ht 71.0 in | Wt 173.0 lb

## 2022-01-28 DIAGNOSIS — N138 Other obstructive and reflux uropathy: Secondary | ICD-10-CM | POA: Diagnosis not present

## 2022-01-28 DIAGNOSIS — R319 Hematuria, unspecified: Secondary | ICD-10-CM

## 2022-01-28 DIAGNOSIS — Z125 Encounter for screening for malignant neoplasm of prostate: Secondary | ICD-10-CM | POA: Diagnosis not present

## 2022-01-28 DIAGNOSIS — N401 Enlarged prostate with lower urinary tract symptoms: Secondary | ICD-10-CM | POA: Diagnosis not present

## 2022-02-27 ENCOUNTER — Telehealth: Payer: Commercial Managed Care - PPO | Admitting: Family Medicine

## 2022-02-27 DIAGNOSIS — B9689 Other specified bacterial agents as the cause of diseases classified elsewhere: Secondary | ICD-10-CM | POA: Diagnosis not present

## 2022-02-27 DIAGNOSIS — J019 Acute sinusitis, unspecified: Secondary | ICD-10-CM

## 2022-02-27 MED ORDER — AMOXICILLIN-POT CLAVULANATE 875-125 MG PO TABS: 1.0000 | ORAL_TABLET | Freq: Two times a day (BID) | ORAL | 0 refills | Status: AC

## 2022-02-27 MED ORDER — FLUTICASONE PROPIONATE 50 MCG/ACT NA SUSP: 2.0000 | Freq: Every day | NASAL | 0 refills | Status: AC

## 2022-02-27 NOTE — Progress Notes (Signed)
Virtual Visit Consent   Eric Shannon, you are scheduled for a virtual visit with a [facility] provider today. Just as with appointments in the office, your consent must be obtained to participate. Your consent will be active for this visit and any virtual visit you may have with one of our providers in the next 365 days. If you have a MyChart account, a copy of this consent can be sent to you electronically.  As this is a virtual visit, video technology does not allow for your provider to perform a traditional examination. This may limit your provider's ability to fully assess your condition. If your provider identifies any concerns that need to be evaluated in person or the need to arrange testing (such as labs, EKG, etc.), we will make arrangements to do so. Although advances in technology are sophisticated, we cannot ensure that it will always work on either your end or our end. If the connection with a video visit is poor, the visit may have to be switched to a telephone visit. With either a video or telephone visit, we are not always able to ensure that we have a secure connection.  By engaging in this virtual visit, you consent to the provision of healthcare and authorize for your insurance to be billed (if applicable) for the services provided during this visit. Depending on your insurance coverage, you may receive a charge related to this service.  I need to obtain your verbal consent now. Are you willing to proceed with your visit today? Eric Shannon has provided verbal consent on 02/27/2022 for a virtual visit (video or telephone). Freddy Finner, NP  Date: 02/27/2022 11:52 AM  Virtual Visit via Video Note   I, Freddy Finner, connected with  Eric Shannon  ([id number], May 10, 1980) on 02/27/22 at 11:45 AM EST by a video-enabled telemedicine application and verified that I am speaking with the correct person using two identifiers.  Location: Patient: Virtual Visit Location Patient:  Home Provider: Virtual Visit Location Provider: Home Office   I discussed the limitations of evaluation and management by telemedicine and the availability of in person appointments. The patient expressed understanding and agreed to proceed.    History of Present Illness: Eric Shannon is a 41 y.o. who identifies as a male who was assigned male at birth, and is being seen today for nasal congestion - pressure in the face and nose and cheeks. On going over the last week- has allergy issues and needs to take daily nasal spray and allergy meds to help prevent sinus infection. Denies fever, chills, sore throat, ear pain, chest pain, shortness of breath.  History of sinus infections in the winter.   Problems:  Patient Active Problem List   Diagnosis Date Noted   Acute cough 12/10/2021   Penile lesion 07/11/2021   Excessive sweating 08/29/2020   Epigastric pain 06/14/2020   Abnormal urine odor 03/28/2020   Disrupted sleep-wake cycle 03/19/2020   Recurrent sinusitis 07/28/2019   Penile rash 06/28/2019   Environmental and seasonal allergies 06/28/2019   Pearly penile papules 11/26/2018   Screening for STD (sexually transmitted disease) 02/11/2018   Tinea versicolor 12/15/2016   Arthrodesis status 09/30/2016   Spinal arachnoid cyst 07/29/2016   Left cervical radiculopathy 07/24/2016   Chronic headache 07/09/2016   Penile discharge 03/14/2016   Encounter for annual general medical examination with abnormal findings in adult 12/07/2015   HNP (herniated nucleus pulposus), cervical 08/27/2015   Chronic neck and back pain  07/02/2015   Hyperthyroidism 05/14/2015   Skin mass 05/14/2015   Weak urinary stream 05/14/2015   Chiari malformation 05/13/2011   Craniocerebral injury (HCC) 05/13/2011   Chest pain, unspecified 04/29/2011   Hypertension 04/29/2011   Palpitations 04/29/2011   Irregular heart beat 04/28/2011    Allergies: No Known Allergies Medications:  Current Outpatient  Medications:    albuterol (VENTOLIN HFA) 108 (90 Base) MCG/ACT inhaler, Inhale 2 puffs into the lungs every 6 (six) hours as needed for wheezing or shortness of breath., Disp: 8 g, Rfl: 2   azithromycin (ZITHROMAX) 250 MG tablet, 2 tab po x 1 day then 1 tab po daily, Disp: 6 tablet, Rfl: 0   methimazole (TAPAZOLE) 5 MG tablet, Take 5 mg by mouth 3 (three) times daily., Disp: , Rfl:    metoprolol succinate (TOPROL XL) 25 MG 24 hr tablet, Take 1 tablet (25 mg total) by mouth daily., Disp: 30 tablet, Rfl: 3   Multiple Vitamin (MULTI-VITAMINS) TABS, Take by mouth., Disp: , Rfl:    predniSONE (DELTASONE) 10 MG tablet, 3 tabs by mouth daily x 3 days, then 2 tabs by mouth daily x 2 days then 1 tab by mouth daily x 2 days, Disp: 15 tablet, Rfl: 0   triamcinolone cream (KENALOG) 0.1 %, Apply 1 Application topically 2 (two) times daily., Disp: 30 g, Rfl: 0  Observations/Objective: Patient is well-developed, well-nourished in no acute distress.  Resting comfortably  at home.  Head is normocephalic, atraumatic.  No labored breathing.  Speech is clear and coherent with logical content.  Patient is alert and oriented at baseline.    Assessment and Plan:  1. Acute bacterial sinusitis  - amoxicillin-clavulanate (AUGMENTIN) 875-125 MG tablet; Take 1 tablet by mouth 2 (two) times daily for 7 days.  Dispense: 14 tablet; Refill: 0  -Take meds as prescribed -Rest -Use a cool mist humidifier especially during the winter months when heat dries out the air. - Use saline nose sprays frequently to help soothe nasal passages and promote drainage. -Saline irrigations of the nose can be very helpful if done frequently.             * 4X daily for 1 week*             * Use of a nettie pot can be helpful with this.  *Follow directions with this* *Boiled or distilled water only -stay hydrated by drinking plenty of fluids - Keep thermostat turn down low to prevent drying out sinuses - For any cough or congestion-  robitussin DM or Delsym as needed - For fever or aches or pains- take tylenol or ibuprofen as directed on bottle             * for fevers greater than 101 orally you may alternate ibuprofen and tylenol every 3 hours.  If you do not improve you will need a follow up visit in person.              Reviewed side effects, risks and benefits of medication.    Patient acknowledged agreement and understanding of the plan.   Past Medical, Surgical, Social History, Allergies, and Medications have been Reviewed.    Follow Up Instructions: I discussed the assessment and treatment plan with the patient. The patient was provided an opportunity to ask questions and all were answered. The patient agreed with the plan and demonstrated an understanding of the instructions.  A copy of instructions were sent to the patient via MyChart unless otherwise  noted below.     The patient was advised to call back or seek an in-person evaluation if the symptoms worsen or if the condition fails to improve as anticipated.  Time:  I spent 7 minutes with the patient via telehealth technology discussing the above problems/concerns.    Freddy Finner, NP

## 2022-02-27 NOTE — Patient Instructions (Signed)
Eric Shannon, thank you for joining Freddy Finner, NP for today's virtual visit.  While this provider is not your primary care provider (PCP), if your PCP is located in our provider database this encounter information will be shared with them immediately following your visit.   A [facility] MyChart account gives you access to today's visit and all your visits, tests, and labs performed at [facility] " click here if you don't have a [facility] MyChart account or go to [email or web address]  Consent: (Patient) Eric Shannon provided verbal consent for this virtual visit at the beginning of the encounter.  Current Medications:  Current Outpatient Medications:    amoxicillin-clavulanate (AUGMENTIN) 875-125 MG tablet, Take 1 tablet by mouth 2 (two) times daily for 7 days., Disp: 14 tablet, Rfl: 0   fluticasone (FLONASE) 50 MCG/ACT nasal spray, Place 2 sprays into both nostrils daily., Disp: 16 g, Rfl: 0   albuterol (VENTOLIN HFA) 108 (90 Base) MCG/ACT inhaler, Inhale 2 puffs into the lungs every 6 (six) hours as needed for wheezing or shortness of breath., Disp: 8 g, Rfl: 2   methimazole (TAPAZOLE) 5 MG tablet, Take 5 mg by mouth 3 (three) times daily., Disp: , Rfl:    metoprolol succinate (TOPROL XL) 25 MG 24 hr tablet, Take 1 tablet (25 mg total) by mouth daily., Disp: 30 tablet, Rfl: 3   Multiple Vitamin (MULTI-VITAMINS) TABS, Take by mouth., Disp: , Rfl:    Medications ordered in this encounter:  Meds ordered this encounter  Medications   amoxicillin-clavulanate (AUGMENTIN) 875-125 MG tablet    Sig: Take 1 tablet by mouth 2 (two) times daily for 7 days.    Dispense:  14 tablet    Refill:  0    Order Specific Question:   Supervising Provider    Answer:   Merrilee Jansky [[id number]]   fluticasone (FLONASE) 50 MCG/ACT nasal spray    Sig: Place 2 sprays into both nostrils daily.    Dispense:  16 g    Refill:  0    Order Specific Question:   Supervising Provider     Answer:   Merrilee Jansky [id number]     *If you need refills on other medications prior to your next appointment, please contact your pharmacy*  Follow-Up: Call back or seek an in-person evaluation if the symptoms worsen or if the condition fails to improve as anticipated.  [facility] [phone]  Other Instructions  -Take meds as prescribed -Rest -Use a cool mist humidifier especially during the winter months when heat dries out the air. - Use saline nose sprays frequently to help soothe nasal passages and promote drainage. -Saline irrigations of the nose can be very helpful if done frequently.             * 4X daily for 1 week*             * Use of a nettie pot can be helpful with this.  *Follow directions with this* *Boiled or distilled water only -stay hydrated by drinking plenty of fluids - Keep thermostat turn down low to prevent drying out sinuses - For any cough or congestion- robitussin DM or Delsym as needed - For fever or aches or pains- take tylenol or ibuprofen as directed on bottle             * for fevers greater than 101 orally you may alternate ibuprofen and tylenol every 3 hours.  If you do  not improve you will need a follow up visit in person.                  If you have been instructed to have an in-person evaluation today at a local Urgent Care facility, please use the link below. It will take you to a list of all of our available [facility], including address, phone number and hours of operation. Please do not delay care.  [facility]  If you or a family member do not have a primary care provider, use the link below to schedule a visit and establish care. When you choose a [facility] primary care physician or advanced practice provider, you gain a long-term partner in health. Find a Primary Care Provider  Learn more about [facility]'s in-office and virtual care options: [facility]

## 2022-05-06 ENCOUNTER — Ambulatory Visit (INDEPENDENT_AMBULATORY_CARE_PROVIDER_SITE_OTHER)
Admission: RE | Admit: 2022-05-06 | Discharge: 2022-05-06 | Disposition: A | Discharge status: Home / Self Care | Payer: Commercial Managed Care - PPO | Source: Ambulatory Visit | Attending: Primary Care | Admitting: Primary Care

## 2022-05-06 ENCOUNTER — Encounter: Payer: Self-pay | Admitting: Primary Care

## 2022-05-06 ENCOUNTER — Ambulatory Visit: Payer: Commercial Managed Care - PPO | Admitting: Primary Care

## 2022-05-06 VITALS — BP 138/82 | HR 103 | Temp 98.2°F | Ht 71.0 in | Wt 178.0 lb

## 2022-05-06 DIAGNOSIS — R053 Chronic cough: Secondary | ICD-10-CM | POA: Diagnosis not present

## 2022-05-06 DIAGNOSIS — G8929 Other chronic pain: Secondary | ICD-10-CM

## 2022-05-06 DIAGNOSIS — G44209 Tension-type headache, unspecified, not intractable: Secondary | ICD-10-CM | POA: Diagnosis not present

## 2022-05-06 DIAGNOSIS — M545 Low back pain, unspecified: Secondary | ICD-10-CM

## 2022-05-06 DIAGNOSIS — I10 Essential (primary) hypertension: Secondary | ICD-10-CM | POA: Diagnosis not present

## 2022-05-06 MED ORDER — CYCLOBENZAPRINE HCL 5 MG PO TABS: 5.0000 mg | ORAL_TABLET | Freq: Every evening | ORAL | 0 refills | Status: DC | PRN

## 2022-05-06 MED ORDER — LEVOCETIRIZINE DIHYDROCHLORIDE 5 MG PO TABS: 5.0000 mg | ORAL_TABLET | Freq: Every evening | ORAL | 0 refills | Status: DC

## 2022-05-06 MED ORDER — KETOROLAC TROMETHAMINE 60 MG/2ML IM SOLN
60.0000 mg | Freq: Once | INTRAMUSCULAR | Status: AC
  Administered 2022-05-06: 60 mg via INTRAMUSCULAR

## 2022-05-06 MED ORDER — OMEPRAZOLE 20 MG PO CPDR: 20.0000 mg | DELAYED_RELEASE_CAPSULE | Freq: Every day | ORAL | 0 refills | Status: DC

## 2022-05-06 MED ORDER — SUMATRIPTAN SUCCINATE 50 MG PO TABS: ORAL_TABLET | ORAL | 0 refills | Status: DC
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
## 2022-05-06 NOTE — Assessment & Plan Note (Addendum)
MSK vs muscle spasm, likely secondary to strenuous activity required at work.   Start Cyclobenzaprine 5 mg hs PRN.  Recommended using Ice/heat as needed.   He will update if symptoms worsen or do not improve.   I evaluated patient, was consulted regarding treatment, and agree with assessment and plan per Tinnie Gens, RN, DNP student.   Allie Bossier, NP-C

## 2022-05-06 NOTE — Assessment & Plan Note (Addendum)
Symptoms and presentation suggestive of tension headache.  No red flag symptoms on exam.   IM Toradol 60 mg provided today. If no improvement, then start Imitrex 50 mg PRN for abortive treatment. Educated patient to use sparingly and at night if he does not get any relief with Toradol.   He will update if his symptoms do not improve or worsen.  I evaluated patient, was consulted regarding treatment, and agree with assessment and plan per Tinnie Gens, RN, DNP student.   Allie Bossier, NP-C

## 2022-05-06 NOTE — Assessment & Plan Note (Signed)
Improved overall.  Continue to monitor.

## 2022-05-06 NOTE — Patient Instructions (Addendum)
Complete xray(s) prior to leaving today. I will notify you of your results once received.  Please update if your headache does not improve with the Imitrex.   You can take Cyclobenzaprine 5 mg at bedtime for the back pain. This can make you drowsy. It can also help with the headache.   Prescription for Xyzal has been sent. It is an allergy medication. Start Omeprazole 20 mg at night for cough.   It was a pleasure to see you today!

## 2022-05-06 NOTE — Assessment & Plan Note (Addendum)
Differentials include GERD, asthma, allergies.  Switch to Xyzal 5 mg daily for allergies. Continue Flonase daily.   It's possible that symptoms could be secondary to GERD, especially as symptoms are worse during the night.  Start trial of Omeprazole 20 mg at bedtime.  Continue Albuterol as needed.   Chest x-ray pending.  Await results.  He will update.

## 2022-06-20 ENCOUNTER — Ambulatory Visit: Payer: Commercial Managed Care - PPO | Admitting: Primary Care

## 2022-06-20 ENCOUNTER — Ambulatory Visit (INDEPENDENT_AMBULATORY_CARE_PROVIDER_SITE_OTHER)
Admission: RE | Admit: 2022-06-20 | Discharge: 2022-06-20 | Disposition: A | Discharge status: Home / Self Care | Payer: Commercial Managed Care - PPO | Source: Ambulatory Visit | Attending: Primary Care | Admitting: Primary Care

## 2022-06-20 VITALS — BP 136/82 | HR 85 | Temp 98.4°F | Ht 71.0 in | Wt 186.0 lb

## 2022-06-20 DIAGNOSIS — M545 Low back pain, unspecified: Secondary | ICD-10-CM

## 2022-06-20 DIAGNOSIS — R519 Headache, unspecified: Secondary | ICD-10-CM | POA: Diagnosis not present

## 2022-06-20 DIAGNOSIS — G8929 Other chronic pain: Secondary | ICD-10-CM

## 2022-06-20 MED ORDER — RIZATRIPTAN BENZOATE 5 MG PO TABS
ORAL_TABLET | ORAL | 0 refills | Status: DC
Start: 2022-06-20 — End: 2023-11-25

## 2022-06-20 MED ORDER — AMITRIPTYLINE HCL 25 MG PO TABS: 25.0000 mg | ORAL_TABLET | Freq: Every day | ORAL | 0 refills | Status: DC

## 2022-06-20 NOTE — Patient Instructions (Signed)
You will be contacted via phone regarding your MRI.  Complete xray(s) prior to leaving today. I will notify you of your results once received.  Start amitriptyline 25 mg at bedtime for headaches.   Stop sumatriptan (Imitrex) for bad headaches.  Starr rizatriptan (Maxalt) as needed for bad headaches.  It was a pleasure to see you today!
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
## 2022-06-20 NOTE — Assessment & Plan Note (Signed)
Continued.  Checking lumbar plain films today. Recommended PT, he declines for now.  Discussed stretching.  Await results.

## 2022-06-20 NOTE — Assessment & Plan Note (Signed)
Current treatment ineffective. Also needs preventative treatment at this point.  Stop sumatriptan. Switch to rizatriptan 5 mg PRN.  Start amitriptyline 25 mg HS for prevention.  Will update MRI brain given increased frequency of headaches. Consider neurology referral vs follow up with neurosurgeon.   He will update.

## 2022-06-25 ENCOUNTER — Encounter: Payer: Self-pay | Admitting: *Deleted

## 2022-07-04 ENCOUNTER — Ambulatory Visit
Admission: RE | Admit: 2022-07-04 | Discharge: 2022-07-04 | Disposition: A | Discharge status: Home / Self Care | Payer: Commercial Managed Care - PPO | Source: Ambulatory Visit | Attending: Primary Care | Admitting: Primary Care

## 2022-07-04 DIAGNOSIS — R519 Headache, unspecified: Secondary | ICD-10-CM | POA: Diagnosis present

## 2022-11-06 ENCOUNTER — Encounter: Payer: Self-pay | Admitting: Family Medicine

## 2022-11-06 ENCOUNTER — Telehealth (INDEPENDENT_AMBULATORY_CARE_PROVIDER_SITE_OTHER): Payer: Commercial Managed Care - PPO | Admitting: Family Medicine

## 2022-11-06 VITALS — Ht 71.0 in | Wt 180.0 lb

## 2022-11-06 DIAGNOSIS — J302 Other seasonal allergic rhinitis: Secondary | ICD-10-CM

## 2022-11-06 DIAGNOSIS — J0140 Acute pansinusitis, unspecified: Secondary | ICD-10-CM | POA: Insufficient documentation

## 2022-11-06 MED ORDER — AMOXICILLIN 500 MG PO CAPS
1000.0000 mg | ORAL_CAPSULE | Freq: Two times a day (BID) | ORAL | 0 refills | Status: DC
Start: 2022-11-06 — End: 2022-12-26

## 2022-11-06 MED ORDER — PREDNISONE 20 MG PO TABS: ORAL_TABLET | ORAL | 0 refills | Status: DC

## 2022-11-06 MED ORDER — LEVOCETIRIZINE DIHYDROCHLORIDE 5 MG PO TABS: 5.0000 mg | ORAL_TABLET | Freq: Every evening | ORAL | 3 refills | Status: DC

## 2022-11-06 MED ORDER — LEVOCETIRIZINE DIHYDROCHLORIDE 5 MG PO TABS
5.0000 mg | ORAL_TABLET | Freq: Every evening | ORAL | 3 refills | Status: AC
Start: 2022-11-06 — End: ?

## 2022-11-06 NOTE — Progress Notes (Signed)
VIRTUAL VISIT A virtual visit is felt to be most appropriate for this patient at this time.   I connected with the patient on 11/06/22 at  3:20 PM EDT by virtual telehealth platform and verified that I am speaking with the correct person using two identifiers.   I discussed the limitations, risks, security and privacy concerns of performing an evaluation and management service by  virtual telehealth platform and the availability of in person appointments. I also discussed with the patient that there may be a patient responsible charge related to this service. The patient expressed understanding and agreed to proceed.  Patient location: Home Provider Location: [place] [facility] [facility] Participants: Arham Symmonds Ermalene Searing and [name]   Chief Complaint  Patient presents with   Sinus Pressure   Nasal Congestion    History of Present Illness:  42 y.o. male patient of Doreene Nest, NP presents with  sinus symptoms.   Date of onset: 1 week ago.. works in a dusty environment Initial symptoms included   bilateral face pain above and below eye. Yellow discharge. Symptoms progressed to no cough, no SOB, no wheeze. No body aches.  no fever.  Sick contacts: none COVID testing:   none     He has tried to treat with  nasal  steroids, Xyzal     Has history of seasonal asthma.  Former smoker. History of frequent sinus issues.   COVID 19 screen No recent travel or known exposure to COVID19 The patient denies respiratory symptoms of COVID 19 at this time.  The importance of social distancing was discussed today.   Review of Systems  Constitutional:  Negative for chills and fever.  HENT:  Positive for sinus pain. Negative for congestion, ear pain and sore throat.   Eyes:  Negative for pain and redness.  Respiratory:  Negative for cough and shortness of breath.   Cardiovascular:  Negative for chest pain, palpitations and leg swelling.  Gastrointestinal:  Negative for abdominal pain, blood  in stool, constipation, diarrhea, nausea and vomiting.  Genitourinary:  Negative for dysuria.  Musculoskeletal:  Negative for falls and myalgias.  Skin:  Negative for rash.  Neurological:  Negative for dizziness.  Psychiatric/Behavioral:  Negative for depression. The patient is not nervous/anxious.       Past Medical History:  Diagnosis Date   Asthma    Chickenpox    Elevated blood pressure    Migraine     reports that he quit smoking about 23 years ago. His smoking use included cigarettes. He has never used smokeless tobacco. He reports current alcohol use. He reports that he does not use drugs.   Current Outpatient Medications:    albuterol (VENTOLIN HFA) 108 (90 Base) MCG/ACT inhaler, Inhale 2 puffs into the lungs every 6 (six) hours as needed for wheezing or shortness of breath., Disp: 8 g, Rfl: 2   amitriptyline (ELAVIL) 25 MG tablet, Take 1 tablet (25 mg total) by mouth at bedtime. For headaches, Disp: 90 tablet, Rfl: 0   amoxicillin (AMOXIL) 500 MG capsule, Take 2 capsules (1,000 mg total) by mouth 2 (two) times daily., Disp: 40 capsule, Rfl: 0   cyclobenzaprine (FLEXERIL) 5 MG tablet, Take 1 tablet (5 mg total) by mouth at bedtime as needed for muscle spasms., Disp: 30 tablet, Rfl: 0   fluticasone (FLONASE) 50 MCG/ACT nasal spray, Place 2 sprays into both nostrils daily., Disp: 16 g, Rfl: 0   methimazole (TAPAZOLE) 5 MG tablet, Take 5 mg by mouth 3 (three)  times daily., Disp: , Rfl:    Multiple Vitamin (MULTI-VITAMINS) TABS, Take by mouth., Disp: , Rfl:    omeprazole (PRILOSEC) 20 MG capsule, Take 1 capsule (20 mg total) by mouth at bedtime. For cough, Disp: 90 capsule, Rfl: 0   predniSONE (DELTASONE) 20 MG tablet, 3 tabs by mouth daily x 3 days, then 2 tabs by mouth daily x 2 days then 1 tab by mouth daily x 2 days, Disp: 15 tablet, Rfl: 0   rizatriptan (MAXALT) 5 MG tablet, Take 1 tablet by mouth at migraine onset. May repeat in 2 hours if needed, Disp: 10 tablet, Rfl: 0    levocetirizine (XYZAL) 5 MG tablet, Take 1 tablet (5 mg total) by mouth every evening. For allergies, Disp: 90 tablet, Rfl: 3   Observations/Objective: Height 5\' 11"  (1.803 m), weight 180 lb (81.6 kg).  Physical Exam Constitutional:      General: The patient is not in acute distress. Pulmonary:     Effort: Pulmonary effort is normal. No respiratory distress.  Neurological:     Mental Status: The patient is alert and oriented to person, place, and time.  Psychiatric:        Mood and Affect: Mood normal.        Behavior: Behavior normal.    Assessment and Plan Acute non-recurrent pansinusitis Assessment & Plan: Acute, ongoing greater than 1 week.  Likely started with allergic sinusitis now with concerns for bacterial component.  Patient does have issues with bacterial sinusitis frequently in the past.  Recommended continuing nasal spray as well as Xyzal 10 mg p.o. nightly. Will treat with prednisone taper and amoxicillin 500 mg 2 tablets twice daily for 10 days.  Encouraged patient to call if symptoms or not improving as expected with prednisone and antibiotics given issues with recurrent pansinusitis in the past.    Seasonal allergic rhinitis, unspecified trigger  Other orders -     Amoxicillin; Take 2 capsules (1,000 mg total) by mouth 2 (two) times daily.  Dispense: 40 capsule; Refill: 0 -     predniSONE; 3 tabs by mouth daily x 3 days, then 2 tabs by mouth daily x 2 days then 1 tab by mouth daily x 2 days  Dispense: 15 tablet; Refill: 0 -     Levocetirizine Dihydrochloride; Take 1 tablet (5 mg total) by mouth every evening. For allergies  Dispense: 90 tablet; Refill: 3      I discussed the assessment and treatment plan with the patient. The patient was provided an opportunity to ask questions and all were answered. The patient agreed with the plan and demonstrated an understanding of the instructions.   The patient was advised to call back or seek an in-person evaluation if  the symptoms worsen or if the condition fails to improve as anticipated.     Kerby Nora, MD

## 2022-11-06 NOTE — Assessment & Plan Note (Signed)
Acute, ongoing greater than 1 week.  Likely started with allergic sinusitis now with concerns for bacterial component.  Patient does have issues with bacterial sinusitis frequently in the past.  Recommended continuing nasal spray as well as Xyzal 10 mg p.o. nightly. Will treat with prednisone taper and amoxicillin 500 mg 2 tablets twice daily for 10 days.  Encouraged patient to call if symptoms or not improving as expected with prednisone and antibiotics given issues with recurrent pansinusitis in the past.

## 2022-11-27 ENCOUNTER — Other Ambulatory Visit: Payer: Self-pay | Admitting: Family Medicine

## 2022-11-27 ENCOUNTER — Encounter: Payer: Self-pay | Admitting: Family Medicine

## 2022-11-27 MED ORDER — AZITHROMYCIN 250 MG PO TABS: ORAL_TABLET | ORAL | 0 refills | Status: DC

## 2022-12-01 DIAGNOSIS — G5622 Lesion of ulnar nerve, left upper limb: Secondary | ICD-10-CM | POA: Insufficient documentation

## 2022-12-01 DIAGNOSIS — M25522 Pain in left elbow: Secondary | ICD-10-CM | POA: Insufficient documentation

## 2022-12-01 DIAGNOSIS — G5632 Lesion of radial nerve, left upper limb: Secondary | ICD-10-CM | POA: Insufficient documentation

## 2022-12-20 NOTE — Telephone Encounter (Signed)
Please have patient scheduled with an open provider.

## 2022-12-26 ENCOUNTER — Encounter: Payer: Self-pay | Admitting: Primary Care

## 2022-12-26 ENCOUNTER — Ambulatory Visit: Payer: Commercial Managed Care - PPO | Admitting: Primary Care

## 2022-12-26 VITALS — BP 136/84 | HR 80 | Temp 97.3°F | Ht 71.0 in | Wt 179.0 lb

## 2022-12-26 DIAGNOSIS — M542 Cervicalgia: Secondary | ICD-10-CM

## 2022-12-26 DIAGNOSIS — E0590 Thyrotoxicosis, unspecified without thyrotoxic crisis or storm: Secondary | ICD-10-CM

## 2022-12-26 DIAGNOSIS — Z8042 Family history of malignant neoplasm of prostate: Secondary | ICD-10-CM

## 2022-12-26 DIAGNOSIS — G8929 Other chronic pain: Secondary | ICD-10-CM

## 2022-12-26 DIAGNOSIS — I10 Essential (primary) hypertension: Secondary | ICD-10-CM | POA: Diagnosis not present

## 2022-12-26 DIAGNOSIS — Z113 Encounter for screening for infections with a predominantly sexual mode of transmission: Secondary | ICD-10-CM

## 2022-12-26 DIAGNOSIS — G5622 Lesion of ulnar nerve, left upper limb: Secondary | ICD-10-CM

## 2022-12-26 DIAGNOSIS — R369 Urethral discharge, unspecified: Secondary | ICD-10-CM

## 2022-12-26 DIAGNOSIS — R519 Headache, unspecified: Secondary | ICD-10-CM

## 2022-12-26 DIAGNOSIS — Z0000 Encounter for general adult medical examination without abnormal findings: Secondary | ICD-10-CM | POA: Diagnosis not present

## 2022-12-26 DIAGNOSIS — M549 Dorsalgia, unspecified: Secondary | ICD-10-CM

## 2022-12-26 LAB — COMPREHENSIVE METABOLIC PANEL
ALT: 18 U/L (ref 0–53)
AST: 17 U/L (ref 0–37)
Albumin: 4.5 g/dL (ref 3.5–5.2)
Alkaline Phosphatase: 79 U/L (ref 39–117)
BUN: 15 mg/dL (ref 6–23)
CO2: 31 meq/L (ref 19–32)
Calcium: 9.5 mg/dL (ref 8.4–10.5)
Chloride: 100 meq/L (ref 96–112)
Creatinine, Ser: 1.05 mg/dL (ref 0.40–1.50)
GFR: 88.68 mL/min (ref >60.00)
Glucose, Bld: 90 mg/dL (ref 70–99)
Potassium: 4.1 meq/L (ref 3.5–5.1)
Sodium: 139 meq/L (ref 135–145)
Total Bilirubin: 0.5 mg/dL (ref 0.2–1.2)
Total Protein: 7.2 g/dL (ref 6.0–8.3)

## 2022-12-26 LAB — PSA: PSA: 1.1 ng/mL (ref 0.10–4.00)

## 2022-12-26 MED ORDER — AMITRIPTYLINE HCL 25 MG PO TABS
25.0000 mg | ORAL_TABLET | Freq: Every day | ORAL | 3 refills | Status: DC
Start: 2022-12-26 — End: 2023-11-25
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
## 2022-12-26 NOTE — Assessment & Plan Note (Signed)
Following with urology. PSA level pending today.

## 2022-12-26 NOTE — Assessment & Plan Note (Signed)
Uncontrolled.  Resume amitriptyline 25 mg at bedtime. He will update.  Continue rizatriptan 5 mg as needed.

## 2022-12-26 NOTE — Assessment & Plan Note (Signed)
Stable.       - Continue to monitor

## 2022-12-26 NOTE — Assessment & Plan Note (Signed)
Chronic.  Following with urology.  Reviewed urology office notes from November 2023.

## 2022-12-26 NOTE — Assessment & Plan Note (Signed)
Immunizations UTD. Declines influenza vaccine.  PSA due and pending.  Discussed the importance of a healthy diet and regular exercise in order for weight loss, and to reduce the risk of further co-morbidity.  Exam stable. Labs pending.  Follow up in 1 year for repeat physical.

## 2022-12-26 NOTE — Assessment & Plan Note (Addendum)
Following with endocrinology, office notes and labs reviewed from July 2024 through [facility].  Continue methimazole 5 mg once daily.

## 2022-12-26 NOTE — Patient Instructions (Signed)
Stop by the lab prior to leaving today. I will notify you of your results once received.   Resume amitriptyline 25 mg at bedtime for headache prevention and chronic pain.  It was a pleasure to see you today!

## 2022-12-26 NOTE — Assessment & Plan Note (Signed)
Asymptomatic. Labs pending today.

## 2022-12-26 NOTE — Assessment & Plan Note (Addendum)
Diagnosed per recent EMG testing per orthopedics.  Following with orthopedics and hand surgery, office notes reviewed from October 2024.

## 2022-12-26 NOTE — Assessment & Plan Note (Signed)
Following with orthopedics.  Recommended he start amitriptyline 25 mg at bedtime which was originally prescribed for headache prevention and pain. Prescription sent to pharmacy.

## 2022-12-27 LAB — HEPATITIS C ANTIBODY: Hepatitis C Ab: NONREACTIVE

## 2022-12-27 LAB — C. TRACHOMATIS/N. GONORRHOEAE RNA
C. trachomatis RNA, TMA: NOT DETECTED
N. gonorrhoeae RNA, TMA: NOT DETECTED

## 2022-12-27 LAB — HIV ANTIBODY (ROUTINE TESTING W REFLEX): HIV 1&2 Ab, 4th Generation: NONREACTIVE

## 2022-12-27 LAB — RPR: RPR Ser Ql: NONREACTIVE

## 2022-12-27 LAB — TRICHOMONAS VAGINALIS, PROBE AMP: Trichomonas vaginalis RNA: NOT DETECTED

## 2023-01-21 ENCOUNTER — Other Ambulatory Visit: Payer: Commercial Managed Care - PPO

## 2023-01-23 ENCOUNTER — Other Ambulatory Visit: Payer: Self-pay

## 2023-01-23 DIAGNOSIS — Z125 Encounter for screening for malignant neoplasm of prostate: Secondary | ICD-10-CM

## 2023-01-23 DIAGNOSIS — N401 Enlarged prostate with lower urinary tract symptoms: Secondary | ICD-10-CM

## 2023-01-23 DIAGNOSIS — R319 Hematuria, unspecified: Secondary | ICD-10-CM

## 2023-01-26 ENCOUNTER — Other Ambulatory Visit: Payer: Commercial Managed Care - PPO

## 2023-01-27 ENCOUNTER — Other Ambulatory Visit: Payer: Commercial Managed Care - PPO

## 2023-01-27 DIAGNOSIS — R319 Hematuria, unspecified: Secondary | ICD-10-CM

## 2023-01-27 DIAGNOSIS — N138 Other obstructive and reflux uropathy: Secondary | ICD-10-CM

## 2023-01-27 LAB — URINALYSIS, COMPLETE
Bilirubin, UA: NEGATIVE
Glucose, UA: NEGATIVE
Ketones, UA: NEGATIVE
Leukocytes,UA: NEGATIVE
Nitrite, UA: NEGATIVE
Protein,UA: NEGATIVE
RBC, UA: NEGATIVE
Specific Gravity, UA: <1.005 — ABNORMAL LOW (ref 1.005–1.030)
Urobilinogen, Ur: 0.2 mg/dL (ref 0.2–1.0)
pH, UA: 6 (ref 5.0–7.5)

## 2023-01-27 LAB — MICROSCOPIC EXAMINATION: Bacteria, UA: NONE SEEN
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
## 2023-01-28 LAB — PSA: Prostate Specific Ag, Serum: 1.2 ng/mL (ref 0.0–4.0)

## 2023-01-29 ENCOUNTER — Ambulatory Visit: Payer: Commercial Managed Care - PPO | Admitting: Urology

## 2023-01-29 ENCOUNTER — Encounter: Payer: Self-pay | Admitting: Urology

## 2023-01-29 DIAGNOSIS — Z125 Encounter for screening for malignant neoplasm of prostate: Secondary | ICD-10-CM

## 2023-01-29 DIAGNOSIS — N138 Other obstructive and reflux uropathy: Secondary | ICD-10-CM | POA: Diagnosis not present

## 2023-01-29 DIAGNOSIS — Z87898 Personal history of other specified conditions: Secondary | ICD-10-CM

## 2023-01-29 DIAGNOSIS — R319 Hematuria, unspecified: Secondary | ICD-10-CM

## 2023-01-29 DIAGNOSIS — N401 Enlarged prostate with lower urinary tract symptoms: Secondary | ICD-10-CM | POA: Diagnosis not present

## 2023-02-10 ENCOUNTER — Ambulatory Visit: Payer: Commercial Managed Care - PPO | Admitting: Nurse Practitioner

## 2023-02-10 VITALS — BP 118/88 | HR 98 | Temp 98.7°F | Ht 71.0 in | Wt 174.6 lb

## 2023-02-10 DIAGNOSIS — R369 Urethral discharge, unspecified: Secondary | ICD-10-CM | POA: Diagnosis not present

## 2023-02-10 LAB — POCT URINALYSIS DIPSTICK
Bilirubin, UA: NEGATIVE
Blood, UA: NEGATIVE
Glucose, UA: NEGATIVE
Ketones, UA: NEGATIVE
Leukocytes, UA: NEGATIVE
Nitrite, UA: NEGATIVE
Protein, UA: POSITIVE — AB
Spec Grav, UA: 1.025 (ref 1.010–1.025)
Urobilinogen, UA: 0.2 U/dL
pH, UA: 5.5 (ref 5.0–8.0)

## 2023-02-10 MED ORDER — DOXYCYCLINE HYCLATE 100 MG PO TABS: 100.0000 mg | ORAL_TABLET | Freq: Two times a day (BID) | ORAL | 0 refills | Status: AC

## 2023-02-10 MED ORDER — CEFTRIAXONE SODIUM 500 MG IJ SOLR
500.0000 mg | Freq: Once | INTRAMUSCULAR | Status: AC
  Administered 2023-02-10: 500 mg via INTRAMUSCULAR

## 2023-02-10 NOTE — Progress Notes (Signed)
Acute Office Visit  Subjective:     Patient ID: Eric Shannon, male    DOB: 06/30/1980, 42 y.o.   MRN: [id number]  Chief Complaint  Patient presents with   Penile Discharge    Pt complains of on and off discharge, states that his prostate is enlarged. No itching or burning when urinating. Discharge is white and odorless.     Penile Discharge The patient's primary symptoms include penile discharge. Associated symptoms include diarrhea and frequency. Pertinent negatives include no abdominal pain, chest pain, chills, constipation, dysuria, fever, nausea, shortness of breath or vomiting.   Patient is in today for penile discharge with a history of asthma, penile lesion, penile rash, BPH with bladder outlet obstruction with him he is followed by urology.  Last seen 01/29/2023.  State sthat symptoms started approx 3-4 days ago . States that it is a whiteish color. States that there maybe a concern for STI. States that he has been treated in the past.  State that he had unprotected sex 1 week ago with one partner.   Review of Systems  Constitutional:  Negative for chills and fever.  Respiratory:  Negative for shortness of breath.   Cardiovascular:  Negative for chest pain.  Gastrointestinal:  Positive for diarrhea. Negative for abdominal pain, constipation, nausea and vomiting.  Genitourinary:  Positive for frequency and penile discharge. Negative for dysuria and hematuria.       Penile discharge         Objective:    BP 118/88   Pulse 98   Temp 98.7 F (37.1 C) (Oral)   Ht 5\' 11"  (1.803 m)   Wt 174 lb 9.6 oz (79.2 kg)   SpO2 95%   BMI 24.35 kg/m    Physical Exam Vitals and nursing note reviewed. Exam conducted with a chaperone present Melina Copa, CMA).  Constitutional:      Appearance: Normal appearance.  Cardiovascular:     Rate and Rhythm: Normal rate and regular rhythm.     Heart sounds: Normal heart sounds.  Pulmonary:     Effort: Pulmonary effort is normal.      Breath sounds: Normal breath sounds.  Abdominal:     General: Bowel sounds are normal. There is no distension.     Palpations: There is no mass.     Tenderness: There is no abdominal tenderness. There is no right CVA tenderness or left CVA tenderness.     Hernia: No hernia is present.  Genitourinary:    Penis: Normal.      Comments: No penile discharge noted on exam Neurological:     Mental Status: He is alert.     Results for orders placed or performed in visit on 02/10/23  POCT urinalysis dipstick  Result Value Ref Range   Color, UA dark yellow    Clarity, UA clear    Glucose, UA Negative Negative   Bilirubin, UA neg    Ketones, UA neg    Spec Grav, UA 1.025 1.010 - 1.025   Blood, UA neg    pH, UA 5.5 5.0 - 8.0   Protein, UA Positive (A) Negative   Urobilinogen, UA 0.2 0.2 or 1.0 E.U./dL   Nitrite, UA neg    Leukocytes, UA Negative Negative   Appearance     Odor          Assessment & Plan:   Problem List Items Addressed This Visit       Other   Penile discharge - Primary  Patient has history of the same has been followed by urology but has had a recent unprotected sex encounter and concern for STIs.  States has been treated for the same in the past.  Pending gonorrhea, chlamydia, trichomonas testing.  Empirically cover patient Rocephin 500 mg IM x 1 dose and doxycycline sent to the pharmacy.  Patient abstain from sexual intercourse for 10 days.  UA was negative in office      Relevant Medications   doxycycline (VIBRA-TABS) 100 MG tablet   Other Relevant Orders   POCT urinalysis dipstick (Completed)   C. trachomatis/N. gonorrhoeae RNA   Trichomonas vaginalis, RNA    Meds ordered this encounter  Medications   cefTRIAXone (ROCEPHIN) injection 500 mg   doxycycline (VIBRA-TABS) 100 MG tablet    Sig: Take 1 tablet (100 mg total) by mouth 2 (two) times daily for 7 days.    Dispense:  14 tablet    Refill:  0    Order Specific Question:   Supervising  Provider    Answer:   TOWER, MARNE A [[id number]]    Return if symptoms worsen or fail to improve.  Audria Nine, NP

## 2023-02-10 NOTE — Patient Instructions (Signed)
Nice to see you today We went ahead and treated you in office I will be in touch with the lab results once I have them Follow up if you do not improve  Abstain from sex for 10 days

## 2023-02-10 NOTE — Assessment & Plan Note (Signed)
Patient has history of the same has been followed by urology but has had a recent unprotected sex encounter and concern for STIs.  States has been treated for the same in the past.  Pending gonorrhea, chlamydia, trichomonas testing.  Empirically cover patient Rocephin 500 mg IM x 1 dose and doxycycline sent to the pharmacy.  Patient abstain from sexual intercourse for 10 days.  UA was negative in office

## 2023-02-13 LAB — TRICHOMONAS VAGINALIS RNA, QL,MALES: Trichomonas vaginalis RNA: NOT DETECTED

## 2023-02-13 LAB — C. TRACHOMATIS/N. GONORRHOEAE RNA
C. trachomatis RNA, TMA: NOT DETECTED
N. gonorrhoeae RNA, TMA: NOT DETECTED

## 2023-02-16 ENCOUNTER — Encounter: Payer: Self-pay | Admitting: Nurse Practitioner

## 2023-03-05 ENCOUNTER — Ambulatory Visit: Payer: Commercial Managed Care - PPO | Admitting: Internal Medicine

## 2023-03-05 ENCOUNTER — Ambulatory Visit: Payer: Self-pay | Admitting: Primary Care

## 2023-03-05 ENCOUNTER — Encounter: Payer: Self-pay | Admitting: Internal Medicine

## 2023-03-05 VITALS — BP 122/80 | HR 94 | Temp 98.7°F | Ht 71.0 in | Wt 180.0 lb

## 2023-03-05 DIAGNOSIS — R310 Gross hematuria: Secondary | ICD-10-CM

## 2023-03-05 DIAGNOSIS — R319 Hematuria, unspecified: Secondary | ICD-10-CM | POA: Insufficient documentation

## 2023-03-05 LAB — POC URINALSYSI DIPSTICK (AUTOMATED)
Bilirubin, UA: NEGATIVE
Glucose, UA: NEGATIVE
Ketones, UA: NEGATIVE
Leukocytes, UA: NEGATIVE
Nitrite, UA: NEGATIVE
Protein, UA: POSITIVE — AB
Spec Grav, UA: 1.025 (ref 1.010–1.025)
Urobilinogen, UA: 0.2 U/dL
pH, UA: 6 (ref 5.0–8.0)

## 2023-03-05 MED ORDER — SULFAMETHOXAZOLE-TRIMETHOPRIM 800-160 MG PO TABS
1.0000 | ORAL_TABLET | Freq: Two times a day (BID) | ORAL | 1 refills | Status: DC
Start: 2023-03-05 — End: 2023-07-28

## 2023-03-05 NOTE — Assessment & Plan Note (Signed)
No real symptoms of infection--just the blood No history of stones and CT/cysto negative 2021 with prior gross hematuria  Will send culture and GC/chlamydia [facility] Rx with septra DS 1 bid x 10 days (with refill ) in case this could be prostatitis
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
## 2023-03-05 NOTE — Telephone Encounter (Signed)
Copied from CRM [id number]. Topic: Clinical - Red Word Triage >> Mar 05, 2023  8:56 AM Fredrich Romans wrote: Red Word that prompted transfer to Nurse Triage: urinating blood and blood clots   Chief Complaint: Patient reports he is has blood and blood clots  the size of half of  a pea. Patient has been told he has Protein in his urine. Patient states he believes he has a sickle cell trait but unsure. Symptoms: Urinating Blood Frequency:  every time he urinates Pertinent Negatives: Patient denies painful urination Disposition: [] ED /[] Urgent Care (no appt availability in office) / [x] Appointment(In office/virtual)/ []  [facility]/ [] Home Care/ [] Refused Recommended Disposition /[] [place] [facility]/ []  Follow-up with PCP   Reason for Disposition  Known sickle cell disease  Answer Assessment - Initial Assessment Questions 1. COLOR of URINE: "Describe the color of the urine."  (e.g., tea-colored, pink, red, bloody) "Do you have blood clots in your urine?" (e.g., none, pea, grape, small coin)     Urine is pink with blood clots with blood clots   multiple clots half the pea size   2. ONSET: "When did the bleeding start?"      Monday 3. EPISODES: "How many times has there been blood in the urine?" or "How many times today?"       Drinks a lot of water .  He uses it a minimum of 10 . 4. PAIN with URINATION: "Is there any pain with passing your urine?" If Yes, ask: "How bad is the pain?"  (Scale 1-10; or mild, moderate, severe)   Lower back pain a    - MILD: Complains slightly about urination hurting.    - MODERATE: Interferes with normal activities.      - SEVERE: Excruciating, unwilling or unable to urinate because of the pain.        Lower abdominal  is all the way abdomen. Lower back  pain.  Denies  pain when urination  Tuesday his pain 10 out 10  but now 3 out 10  5. FEVER: "Do you have a fever?" If Yes, ask: "What is your temperature, how was it measured, and when did it start?"      Denies 6. ASSOCIATED SYMPTOMS: "Are you passing urine more frequently than usual?"      10 times a day is his normal  7. OTHER SYMPTOMS: "Do you have any other symptoms?" (e.g., back/flank pain, abdomen pain, vomiting)    Protein in urine , Enlarged Prostate.  Protocols used: Urine - Blood In-A-AH

## 2023-03-05 NOTE — Addendum Note (Signed)
Addended by: Eual Fines on: 03/05/2023 11:09 AM   Modules accepted: Orders

## 2023-03-05 NOTE — Addendum Note (Signed)
Addended by: Eual Fines on: 03/05/2023 11:13 AM   Modules accepted: Orders

## 2023-03-06 LAB — URINE CULTURE
MICRO NUMBER:: 15890695
Result:: NO GROWTH
SPECIMEN QUALITY:: ADEQUATE

## 2023-03-06 LAB — C. TRACHOMATIS/N. GONORRHOEAE RNA
C. trachomatis RNA, TMA: NOT DETECTED
N. gonorrhoeae RNA, TMA: NOT DETECTED

## 2023-03-12 DIAGNOSIS — R21 Rash and other nonspecific skin eruption: Secondary | ICD-10-CM
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
## 2023-03-19 ENCOUNTER — Ambulatory Visit: Payer: Commercial Managed Care - PPO | Admitting: Urology

## 2023-03-19 ENCOUNTER — Encounter: Payer: Self-pay | Admitting: Urology

## 2023-03-19 VITALS — BP 141/84 | HR 76 | Ht 71.0 in | Wt 180.0 lb

## 2023-03-19 DIAGNOSIS — R310 Gross hematuria: Secondary | ICD-10-CM

## 2023-03-19 DIAGNOSIS — N401 Enlarged prostate with lower urinary tract symptoms: Secondary | ICD-10-CM

## 2023-03-19 DIAGNOSIS — N138 Other obstructive and reflux uropathy: Secondary | ICD-10-CM

## 2023-03-19 DIAGNOSIS — Z87898 Personal history of other specified conditions: Secondary | ICD-10-CM | POA: Diagnosis not present

## 2023-03-19 DIAGNOSIS — R319 Hematuria, unspecified: Secondary | ICD-10-CM

## 2023-03-19 LAB — URINALYSIS, COMPLETE
Bilirubin, UA: NEGATIVE
Glucose, UA: NEGATIVE
Ketones, UA: NEGATIVE
Nitrite, UA: NEGATIVE
Protein,UA: NEGATIVE
Specific Gravity, UA: 1.02 (ref 1.005–1.030)
Urobilinogen, Ur: 0.2 mg/dL (ref 0.2–1.0)
pH, UA: 5.5 (ref 5.0–7.5)

## 2023-03-19 LAB — MICROSCOPIC EXAMINATION: RBC, Urine: >30 /[HPF] — AB (ref 0–2)

## 2023-03-22 LAB — CULTURE, URINE COMPREHENSIVE

## 2023-03-26 ENCOUNTER — Ambulatory Visit
Admission: RE | Admit: 2023-03-26 | Discharge: 2023-03-26 | Disposition: A | Discharge status: Home / Self Care | Payer: Commercial Managed Care - PPO | Source: Ambulatory Visit | Attending: Urology | Admitting: Urology

## 2023-03-26 DIAGNOSIS — R310 Gross hematuria: Secondary | ICD-10-CM | POA: Diagnosis present

## 2023-03-26 MED ORDER — IOHEXOL 300 MG/ML  SOLN
150.0000 mL | Freq: Once | INTRAMUSCULAR | Status: AC | PRN
  Administered 2023-03-26: 150 mL via INTRAVENOUS

## 2023-03-26 MED ORDER — IOHEXOL 300 MG/ML  SOLN: 100.0000 mL | Freq: Once | INTRAMUSCULAR | Status: DC | PRN

## 2023-04-08 ENCOUNTER — Ambulatory Visit: Payer: Commercial Managed Care - PPO | Admitting: Urology

## 2023-04-08 VITALS — BP 170/93 | HR 86 | Ht 71.0 in | Wt 180.0 lb

## 2023-04-08 DIAGNOSIS — R310 Gross hematuria: Secondary | ICD-10-CM | POA: Diagnosis not present

## 2023-04-08 DIAGNOSIS — N400 Enlarged prostate without lower urinary tract symptoms: Secondary | ICD-10-CM

## 2023-04-08 LAB — URINALYSIS, COMPLETE
Bilirubin, UA: NEGATIVE
Glucose, UA: NEGATIVE
Ketones, UA: NEGATIVE
Leukocytes,UA: NEGATIVE
Nitrite, UA: NEGATIVE
Protein,UA: NEGATIVE
Specific Gravity, UA: 1.02 (ref 1.005–1.030)
Urobilinogen, Ur: 0.2 mg/dL (ref 0.2–1.0)
pH, UA: 5.5 (ref 5.0–7.5)

## 2023-04-08 LAB — MICROSCOPIC EXAMINATION: Bacteria, UA: NONE SEEN
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
## 2023-04-10 ENCOUNTER — Encounter: Payer: Self-pay | Admitting: Urology

## 2023-04-25 ENCOUNTER — Telehealth: Payer: Self-pay | Admitting: Urology

## 2023-04-25 NOTE — Telephone Encounter (Signed)
 Urine cytology showed no cells suspicious for bladder cancer

## 2023-04-27 NOTE — Telephone Encounter (Signed)
 Patient aware of results.

## 2023-06-22 DIAGNOSIS — M7711 Lateral epicondylitis, right elbow: Secondary | ICD-10-CM | POA: Insufficient documentation

## 2023-06-22 DIAGNOSIS — G5620 Lesion of ulnar nerve, unspecified upper limb: Secondary | ICD-10-CM | POA: Insufficient documentation

## 2023-07-21 ENCOUNTER — Other Ambulatory Visit: Payer: Self-pay

## 2023-07-21 DIAGNOSIS — Z125 Encounter for screening for malignant neoplasm of prostate: Secondary | ICD-10-CM

## 2023-07-21 DIAGNOSIS — R319 Hematuria, unspecified: Secondary | ICD-10-CM

## 2023-07-21 DIAGNOSIS — N138 Other obstructive and reflux uropathy: Secondary | ICD-10-CM

## 2023-07-22 LAB — PSA: Prostate Specific Ag, Serum: 1.2 ng/mL (ref 0.0–4.0)

## 2023-07-28 ENCOUNTER — Encounter: Payer: Self-pay | Admitting: Urology

## 2023-07-28 ENCOUNTER — Ambulatory Visit (INDEPENDENT_AMBULATORY_CARE_PROVIDER_SITE_OTHER): Payer: Self-pay | Admitting: Urology

## 2023-07-28 VITALS — BP 143/98 | HR 72 | Ht 71.0 in | Wt 175.0 lb

## 2023-07-28 DIAGNOSIS — R319 Hematuria, unspecified: Secondary | ICD-10-CM | POA: Diagnosis not present

## 2023-07-28 DIAGNOSIS — N138 Other obstructive and reflux uropathy: Secondary | ICD-10-CM

## 2023-07-28 DIAGNOSIS — N401 Enlarged prostate with lower urinary tract symptoms: Secondary | ICD-10-CM

## 2023-07-28 DIAGNOSIS — Z125 Encounter for screening for malignant neoplasm of prostate: Secondary | ICD-10-CM
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
## 2023-07-30 DIAGNOSIS — G5621 Lesion of ulnar nerve, right upper limb: Secondary | ICD-10-CM | POA: Insufficient documentation

## 2023-09-09 ENCOUNTER — Other Ambulatory Visit

## 2023-09-09 ENCOUNTER — Ambulatory Visit: Admitting: Primary Care

## 2023-09-09 VITALS — BP 122/86 | HR 83 | Temp 97.8°F | Ht 71.0 in | Wt 169.0 lb

## 2023-09-09 DIAGNOSIS — R220 Localized swelling, mass and lump, head: Secondary | ICD-10-CM | POA: Insufficient documentation

## 2023-09-09 DIAGNOSIS — L729 Follicular cyst of the skin and subcutaneous tissue, unspecified: Secondary | ICD-10-CM | POA: Diagnosis not present

## 2023-09-09 DIAGNOSIS — I10 Essential (primary) hypertension: Secondary | ICD-10-CM | POA: Diagnosis not present

## 2023-09-09 DIAGNOSIS — Z113 Encounter for screening for infections with a predominantly sexual mode of transmission: Secondary | ICD-10-CM

## 2023-09-09 NOTE — Assessment & Plan Note (Signed)
 Exam today representative. No alarm signs on exam.  We discussed the etiology of subcutaneous cysts and that removal is not recommended unless they become problematic. He will set up a follow-up visit with his dermatologist to understand more.

## 2023-09-09 NOTE — Assessment & Plan Note (Signed)
 At craniotomy site.  Not very evident to me, but patient really moves his body better than I.  Neuroexam reassuring Because of his prior history we will obtain a repeat MRI brain.  He agrees.  Orders placed.
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
## 2023-09-09 NOTE — Assessment & Plan Note (Signed)
Asymptomatic. Labs pending today.

## 2023-09-09 NOTE — Patient Instructions (Signed)
 You will receive a phone call regarding the MRI of your brain.  Please schedule appoint with your dermatologist.  Stop by the lab prior to leaving today. I will notify you of your results once received.   It was a pleasure to see you today!

## 2023-09-09 NOTE — Assessment & Plan Note (Signed)
 Controlled.  Continue to monitor.

## 2023-09-10 LAB — HIV ANTIBODY (ROUTINE TESTING W REFLEX): HIV 1&2 Ab, 4th Generation: NONREACTIVE

## 2023-09-10 LAB — C. TRACHOMATIS/N. GONORRHOEAE RNA
C. trachomatis RNA, TMA: NOT DETECTED
N. gonorrhoeae RNA, TMA: NOT DETECTED

## 2023-09-10 LAB — HEPATITIS C ANTIBODY: Hepatitis C Ab: NONREACTIVE

## 2023-09-10 LAB — HSV(HERPES SIMPLEX VRS) I + II AB-IGG
HSV 1 IGG,TYPE SPECIFIC AB: 12.8 {index} — ABNORMAL HIGH
HSV 2 IGG,TYPE SPECIFIC AB: <0.9 {index}

## 2023-09-10 LAB — RPR: RPR Ser Ql: NONREACTIVE

## 2023-09-10 LAB — TRICHOMONAS VAGINALIS, PROBE AMP: Trichomonas vaginalis RNA: NOT DETECTED

## 2023-09-12 ENCOUNTER — Ambulatory Visit: Payer: Self-pay | Admitting: Primary Care

## 2023-09-12 DIAGNOSIS — Z9889 Other specified postprocedural states: Secondary | ICD-10-CM

## 2023-09-21 ENCOUNTER — Telehealth: Payer: Self-pay

## 2023-09-21 NOTE — Telephone Encounter (Signed)
 Called and advised patient to call [facility] and schedule MRI. Information provided.

## 2023-09-21 NOTE — Telephone Encounter (Signed)
 Copied from CRM [id number]. Topic: Referral - Status >> Sep 21, 2023 11:08 AM [place] A wrote: Reason for CRM: Patient was calling to follow up on the scan that is supposed to be scheduled for his head injury-please contact patient

## 2023-09-24 ENCOUNTER — Ambulatory Visit
Admission: RE | Admit: 2023-09-24 | Discharge: 2023-09-24 | Disposition: A | Discharge status: Home / Self Care | Source: Ambulatory Visit | Attending: Primary Care | Admitting: Primary Care

## 2023-09-24 DIAGNOSIS — R220 Localized swelling, mass and lump, head: Secondary | ICD-10-CM | POA: Diagnosis present

## 2023-10-27 ENCOUNTER — Ambulatory Visit: Admitting: Neurosurgery

## 2023-10-27 ENCOUNTER — Encounter: Payer: Self-pay | Admitting: Neurosurgery

## 2023-10-27 VITALS — BP 181/126 | HR 80 | Ht 71.0 in | Wt 173.0 lb

## 2023-10-27 DIAGNOSIS — R609 Edema, unspecified: Secondary | ICD-10-CM | POA: Diagnosis not present

## 2023-10-27 DIAGNOSIS — Z87828 Personal history of other (healed) physical injury and trauma: Secondary | ICD-10-CM

## 2023-10-27 DIAGNOSIS — Z9889 Other specified postprocedural states: Secondary | ICD-10-CM
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
## 2023-10-28 ENCOUNTER — Telehealth: Payer: Self-pay

## 2023-11-01 ENCOUNTER — Ambulatory Visit (HOSPITAL_BASED_OUTPATIENT_CLINIC_OR_DEPARTMENT_OTHER)
Admission: EM | Admit: 2023-11-01 | Discharge: 2023-11-01 | Disposition: A | Discharge status: Home / Self Care | Source: Ambulatory Visit | Attending: Neurosurgery | Admitting: Neurosurgery

## 2023-11-01 DIAGNOSIS — Z9889 Other specified postprocedural states: Secondary | ICD-10-CM | POA: Diagnosis present

## 2023-11-03 ENCOUNTER — Ambulatory Visit: Payer: Commercial Managed Care - PPO | Admitting: Dermatology

## 2023-11-17 ENCOUNTER — Encounter: Payer: Self-pay | Admitting: Neurosurgery

## 2023-11-17 ENCOUNTER — Ambulatory Visit: Admitting: Neurosurgery

## 2023-11-17 VITALS — BP 177/102 | HR 88 | Ht 71.0 in | Wt 169.0 lb

## 2023-11-17 DIAGNOSIS — Z9889 Other specified postprocedural states: Secondary | ICD-10-CM

## 2023-11-17 DIAGNOSIS — W3400XA Accidental discharge from unspecified firearms or gun, initial encounter: Secondary | ICD-10-CM | POA: Diagnosis not present

## 2023-11-17 DIAGNOSIS — R220 Localized swelling, mass and lump, head: Secondary | ICD-10-CM
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
## 2023-11-25 ENCOUNTER — Ambulatory Visit: Attending: Cardiology | Admitting: Cardiology

## 2023-11-25 ENCOUNTER — Encounter: Payer: Self-pay | Admitting: Cardiology

## 2023-11-25 ENCOUNTER — Ambulatory Visit

## 2023-11-25 VITALS — BP 140/84 | HR 72 | Ht 71.0 in | Wt 168.8 lb

## 2023-11-25 DIAGNOSIS — R002 Palpitations: Secondary | ICD-10-CM | POA: Diagnosis not present

## 2023-11-25 DIAGNOSIS — I10 Essential (primary) hypertension: Secondary | ICD-10-CM | POA: Diagnosis not present

## 2023-11-25 DIAGNOSIS — R0600 Dyspnea, unspecified: Secondary | ICD-10-CM | POA: Diagnosis not present

## 2023-11-25 MED ORDER — METOPROLOL SUCCINATE ER 50 MG PO TB24
25.0000 mg | ORAL_TABLET | Freq: Every day | ORAL | 3 refills | Status: AC
Start: 2023-11-25 — End: ?
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
## 2023-11-25 NOTE — Patient Instructions (Signed)
 Medication Instructions:  - INCREASE metoprolol  to 50 mg daily   *If you need a refill on your cardiac medications before your next appointment, please call your pharmacy*  Lab Work: No labs ordered today  If you have labs (blood work) drawn today and your tests are completely normal, you will receive your results only by: MyChart Message (if you have MyChart) OR A paper copy in the mail If you have any lab test that is abnormal or we need to change your treatment, we will call you to review the results.  Testing/Procedures: Your physician has requested that you have an echocardiogram. Echocardiography is a painless test that uses sound waves to create images of your heart. It provides your doctor with information about the size and shape of your heart and how well your heart's chambers and valves are working.   You may receive an ultrasound enhancing agent through an IV if needed to better visualize your heart during the echo. This procedure takes approximately one hour.  There are no restrictions for this procedure.  This will take place at [address] [facility] [address]) [address], [place] [address]  Please note: We ask at that you not bring children with you during ultrasound (echo/ vascular) testing. Due to room size and safety concerns, children are not allowed in the ultrasound rooms during exams. Our front office staff cannot provide observation of children in our lobby area while testing is being conducted. An adult accompanying a patient to their appointment will only be allowed in the ultrasound room at the discretion of the ultrasound technician under special circumstances. We apologize for any inconvenience.   ZIO XT- Long Term Monitor Instructions  Your physician has requested you wear a ZIO patch monitor for 14 days.  This is a single patch monitor. [employer] supplies one patch monitor per enrollment. Additional stickers are not available. Please do not apply patch if you  will be having a Nuclear Stress Test, Echocardiogram, Cardiac CT, MRI, or Chest Xray during the period you would be wearing the monitor. The patch cannot be worn during these tests. You cannot remove and re-apply the ZIO XT patch monitor.  Your ZIO patch monitor will be mailed 3 day USPS to your address on file. It may take 3-5 days to receive your monitor after you have been enrolled. Once you have received your monitor, please review the enclosed instructions. Your monitor has already been registered assigning a specific monitor serial number to you.  Billing and Patient Assistance Program Information  We have supplied [employer] with any of your insurance information on file for billing purposes.  [employer] offers a sliding scale Patient Assistance Program for patients that do not have insurance, or whose insurance does not completely cover the cost of the ZIO monitor.  You must apply for the Patient Assistance Program to qualify for this discounted rate.  To apply, please call [employer] at [phone], select option 4, select option 2, ask to apply for Patient Assistance Program. Meredeth will ask your household income, and how many people are in your household. They will quote your out-of-pocket cost based on that information. [employer] will also be able to set up a 87-month, interest-free payment plan if needed.  Applying the monitor   Shave hair from upper left chest.  Hold abrader disc by orange tab. Rub abrader in 40 strokes over the upper left chest as indicated in your monitor instructions.  Clean area with 4 enclosed alcohol pads. Let dry.  Apply  patch as indicated in monitor instructions. Patch will be placed under collarbone on left side of chest with arrow pointing upward.  Rub patch adhesive wings for 2 minutes. Remove white label marked 1. Remove the white label marked 2. Rub patch adhesive wings for 2 additional minutes.  While looking in a mirror, press and release button in center of  patch. A small green light will flash 3-4 times. This will be your only indicator that the monitor has been turned on.  Do not shower for the first 24 hours. You may shower after the first 24 hours.  Press the button if you feel a symptom. You will hear a small click. Record Date, Time and Symptom in the Patient Logbook.  When you are ready to remove the patch, follow instructions on the last 2 pages of Patient Logbook.  Stick patch monitor into the tabs at the bottom of the return box.  Place Patient Logbook in the blue and white box. Use locking tab on box and tape box closed securely. The blue and white box has prepaid postage on it. Please place it in the mailbox as soon as possible. Your physician should have your test results approximately 7-14 days after the monitor has been mailed back to [facility].  Call [facility] Customer Care at [phone] if you have questions regarding your ZIO XT patch monitor.  Call them immediately if you see an orange light blinking on your monitor.  If your monitor falls off in less than 4 days, contact our Monitor department at [phone].  If your monitor becomes loose or falls off after 4 days call [employer] at [phone] for suggestions on securing your monitor.   Follow-Up: At [facility], you and your health needs are our priority.  As part of our continuing mission to provide you with exceptional heart care, our providers are all part of one team.  This team includes your primary Cardiologist (physician) and Advanced Practice Providers or APPs (Physician Assistants and Nurse Practitioners) who all work together to provide you with the care you need, when you need it.  Your next appointment:   3 month(s)  Provider:   You may see Dr. Darliss or one of the following Advanced Practice Providers on your designated Care Team:   Lonni Meager, NP Lesley Maffucci, PA-C Bernardino Bring, PA-C Cadence [place], PA-C Tylene Lunch,  NP Barnie Hila, NP    We recommend signing up for the patient portal called MyChart.  Sign up information is provided on this After Visit Summary.  MyChart is used to connect with patients for Virtual Visits (Telemedicine).  Patients are able to view lab/test results, encounter notes, upcoming appointments, etc.  Non-urgent messages can be sent to your provider as well.   To learn more about what you can do with MyChart, go to [email or web address].

## 2023-12-01 ENCOUNTER — Telehealth: Payer: Self-pay | Admitting: Cardiology

## 2023-12-01 NOTE — Telephone Encounter (Signed)
 Pt called in stating he received heart monitor. He asked if he can wait a while to wear it due to it being hot. He states last time he had one, he sweated the adhesive off. Please advise.

## 2023-12-03 NOTE — Telephone Encounter (Signed)
 Left Message - Called Eric Shannon to schedule his CT .SABRASABRANo answer left vm to advise him to call back  Left Message - After speaking with Eric Shannon I was able to set his CT scan for this 11/01/23 at 2:45pm @[facility] .SABRASABRAPt called back and I informed him of the CT scan.

## 2023-12-23 ENCOUNTER — Telehealth: Payer: Self-pay | Admitting: Cardiology

## 2023-12-23 NOTE — Telephone Encounter (Signed)
  The patient said he tried wearing the heart monitor again since the weather is a bit cooler, but after one day, it fell off his chest. He stated that it doesn't seem to be working for him and would like to know the next steps, as he is still experiencing the same symptoms.

## 2024-01-13 ENCOUNTER — Other Ambulatory Visit: Payer: Self-pay | Admitting: Cardiology

## 2024-01-13 ENCOUNTER — Ambulatory Visit: Attending: Cardiology

## 2024-01-13 DIAGNOSIS — R002 Palpitations: Secondary | ICD-10-CM

## 2024-01-13 DIAGNOSIS — R0600 Dyspnea, unspecified: Secondary | ICD-10-CM

## 2024-01-13 DIAGNOSIS — I10 Essential (primary) hypertension: Secondary | ICD-10-CM

## 2024-01-13 LAB — ECHOCARDIOGRAM COMPLETE
AR max vel: 2.67 cm2
AV Area VTI: 2.79 cm2
AV Area mean vel: 2.55 cm2
AV Mean grad: 3 mmHg
AV Peak grad: 4.8 mmHg
Ao pk vel: 1.1 m/s
Area-P 1/2: 3.48 cm2
S' Lateral: 3.3 cm

## 2024-01-14 ENCOUNTER — Ambulatory Visit: Payer: Self-pay | Admitting: Cardiology

## 2024-01-28 ENCOUNTER — Other Ambulatory Visit: Payer: Self-pay

## 2024-01-28 ENCOUNTER — Other Ambulatory Visit

## 2024-01-28 DIAGNOSIS — Z125 Encounter for screening for malignant neoplasm of prostate: Secondary | ICD-10-CM

## 2024-01-29 ENCOUNTER — Ambulatory Visit: Payer: Self-pay | Admitting: Urology

## 2024-01-29 LAB — PSA: Prostate Specific Ag, Serum: 1 ng/mL (ref 0.0–4.0)

## 2024-02-03 DIAGNOSIS — R002 Palpitations: Secondary | ICD-10-CM | POA: Diagnosis not present

## 2024-02-08 ENCOUNTER — Ambulatory Visit: Payer: Self-pay | Admitting: Cardiology

## 2024-02-24 ENCOUNTER — Encounter: Payer: Self-pay | Admitting: Cardiology

## 2024-02-24 ENCOUNTER — Ambulatory Visit: Attending: Cardiology | Admitting: Cardiology

## 2024-02-24 VITALS — BP 130/82 | HR 78 | Ht 71.0 in | Wt 171.4 lb

## 2024-02-24 DIAGNOSIS — G4730 Sleep apnea, unspecified: Secondary | ICD-10-CM

## 2024-02-24 DIAGNOSIS — R0602 Shortness of breath: Secondary | ICD-10-CM | POA: Diagnosis not present

## 2024-02-24 DIAGNOSIS — I10 Essential (primary) hypertension: Secondary | ICD-10-CM

## 2024-02-24 DIAGNOSIS — E0590 Thyrotoxicosis, unspecified without thyrotoxic crisis or storm: Secondary | ICD-10-CM

## 2024-02-24 DIAGNOSIS — R002 Palpitations: Secondary | ICD-10-CM

## 2024-02-24 MED ORDER — LEVOCETIRIZINE DIHYDROCHLORIDE 5 MG PO TABS: 5.0000 mg | ORAL_TABLET | Freq: Every evening | ORAL | 3 refills | Status: AC

## 2024-02-24 MED ORDER — METOPROLOL SUCCINATE ER 100 MG PO TB24: 100.0000 mg | ORAL_TABLET | Freq: Every day | ORAL | 3 refills | Status: AC

## 2024-02-24 NOTE — Patient Instructions (Signed)
 Ambulatory referral to EP in [facility]  Medication Instructions:  Your physician recommends the following medication changes.  INCREASE: Toprol  XL to 100 mg daily   *If you need a refill on your cardiac medications before your next appointment, please call your pharmacy*  Lab Work: No labs ordered today  If you have labs (blood work) drawn today and your tests are completely normal, you will receive your results only by: MyChart Message (if you have MyChart) OR A paper copy in the mail If you have any lab test that is abnormal or we need to change your treatment, we will call you to review the results.  Testing/Procedures: Patient agreement reviewed and signed on 02/24/2024. Provide a copy to the patient and send the original to HIM for scanning. STOP-BANG score documented in patient's chart (can document in an existing OV encounter if captured at time of visit, or add STOPBANG SmartPhrase to this encounter).  Ensure [place] order [phone]) is entered as future and signed. Route encounter to CV DIV SLEEP STUDIES pool.  Instructions for Sleep Team: Obtain prior authorization (use BURLPAWATCHPAT SmartPhrase) and route encounter back to covering team member. Covering team member is responsible for calling patient to notify of PA approval and PIN, and to arrange for device pickup and education.   Follow-Up: At [facility], you and your health needs are our priority.  As part of our continuing mission to provide you with exceptional heart care, our providers are all part of one team.  This team includes your primary Cardiologist (physician) and Advanced Practice Providers or APPs (Physician Assistants and Nurse Practitioners) who all work together to provide you with the care you need, when you need it.  Your next appointment:   6 week(s)  Provider:   You may see Redell Cave, MD or one of the following Advanced Practice Providers on your designated Care Team:   Tylene Lunch, NP
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
## 2024-02-25 ENCOUNTER — Telehealth: Payer: Self-pay | Admitting: *Deleted

## 2024-02-25 NOTE — Telephone Encounter (Signed)
-----   Message from Nurse Jon SAUNDERS, RN sent at 02/24/2024  4:48 PM EST ----- Regarding: watchpat Watchpat ordered

## 2024-03-13 ENCOUNTER — Encounter (HOSPITAL_BASED_OUTPATIENT_CLINIC_OR_DEPARTMENT_OTHER): Payer: Self-pay | Admitting: Cardiology

## 2024-03-13 DIAGNOSIS — R0683 Snoring: Secondary | ICD-10-CM | POA: Diagnosis not present

## 2024-03-15 NOTE — Procedures (Signed)
" ° °  SLEEP STUDY REPORT Patient Information Study Date: 03/13/2024 Patient Name: Eric Shannon Patient ID: [id number] Birth Date: Jun 24, 1980 Age: 44 Gender: Male BMI: 24.1 (W=172 lb, H=5' 11'') Referring Physician: Tylene Lunch, NP  TEST DESCRIPTION: Home sleep apnea testing was completed using the WatchPat, a Type 1 device, utilizing  peripheral arterial tonometry (PAT), chest movement, actigraphy, pulse oximetry, pulse rate, body position and snore.  AHI was calculated with apnea and hypopnea using valid sleep time as the denominator. RDI includes apneas,  hypopneas, and RERAs. The data acquired and the scoring of sleep and all associated events were performed in  accordance with the recommended standards and specifications as outlined in the AASM Manual for the Scoring of  Sleep and Associated Events 2.2.0 (2015).   FINDINGS: 1. No evidence of Obstructive Sleep Apnea with AHI 0.3/hr.  2. No Central Sleep Apnea. 3. Oxygen desaturations as low as 92%. 4. Moderate to severe snoring was present. O2 sats were < 88% for 0 minutes. 5. Total sleep time was 3 hrs and 50 min. 6. 20.9% of total sleep time was spent in REM sleep.  7. Normal sleep onset latency at 27 min.  8. Shortened REM sleep onset latency at 52 min.  9. Total awakenings were 15.   DIAGNOSIS:  Normal study with no significant sleep disordered breathing.  RECOMMENDATIONS: 1. Normal study with no significant sleep disordered breathing. 2. Healthy sleep recommendations include: adequate nightly sleep (normal 7-9 hrs/night), avoidance of caffeine after  noon and alcohol near bedtime, and maintaining a sleep environment that is cool, dark and quiet. 3. Weight loss for overweight patients is recommended.  4. Snoring recommendations include: weight loss where appropriate, side sleeping, and avoidance of alcohol before  bed. 5. Operation of motor vehicle or dangerous equipment must be avoided when feeling drowsy, excessively  sleepy, or  mentally fatigued.  6. An ENT consultation which may be useful for specific causes of and possible treatment of bothersome snoring .  7. Weight loss may be of benefit in reducing the severity of snoring.   Signature: Wilbert Bihari, MD; [facility]; Diplomat, American Board of Sleep  Medicine Electronically Signed: 03/15/2024 9:36:46 AM "

## 2024-03-16 ENCOUNTER — Telehealth: Payer: Self-pay | Admitting: Cardiology

## 2024-03-16 NOTE — Telephone Encounter (Signed)
" °  Patient is asking if he bring the Watchpat back or does he dispose of it. Please advise "

## 2024-03-18 NOTE — Telephone Encounter (Signed)
**Note De-Identified Eric Shannon Obfuscation** I checked the [employer] portal and the pt has completed his WatchPAT One-HST successfully. I called the pt and advised him that it is ok for him to discard the device as it is cannot be used again.  He verbalized understanding and thanked me for returning his call.

## 2024-03-30 ENCOUNTER — Ambulatory Visit: Admitting: Student in an Organized Health Care Education/Training Program

## 2024-03-31 ENCOUNTER — Telehealth: Payer: Self-pay | Admitting: *Deleted

## 2024-03-31 NOTE — Telephone Encounter (Signed)
 Patient notified of result via his mychart.

## 2024-03-31 NOTE — Telephone Encounter (Signed)
-----   Message from Wilbert Bihari, MD sent at 03/15/2024  9:37 AM EST ----- Please let patient know that sleep study showed no significant sleep apnea.

## 2024-04-08 ENCOUNTER — Ambulatory Visit: Admitting: Cardiology

## 2024-04-27 ENCOUNTER — Ambulatory Visit: Admitting: Cardiology

## 2024-04-28 ENCOUNTER — Ambulatory Visit: Admitting: Cardiology

## 2024-07-20 ENCOUNTER — Other Ambulatory Visit

## 2024-07-27 ENCOUNTER — Ambulatory Visit: Admitting: Urology
